# Patient Record
Sex: Male | Born: 1935 | Race: White | Hispanic: No | Marital: Married | Smoking: Former smoker
Health system: Southern US, Community
[De-identification: ages and names within clinical notes are randomized; demographics above are authoritative.]

## PROBLEM LIST (undated history)

## (undated) DIAGNOSIS — I739 Peripheral vascular disease, unspecified: Secondary | ICD-10-CM

## (undated) DIAGNOSIS — Z9861 Coronary angioplasty status: Principal | ICD-10-CM

## (undated) DIAGNOSIS — I2510 Atherosclerotic heart disease of native coronary artery without angina pectoris: Secondary | ICD-10-CM

## (undated) DIAGNOSIS — F1011 Alcohol abuse, in remission: Secondary | ICD-10-CM

## (undated) DIAGNOSIS — F0390 Unspecified dementia without behavioral disturbance: Secondary | ICD-10-CM

## (undated) DIAGNOSIS — F419 Anxiety disorder, unspecified: Secondary | ICD-10-CM

## (undated) DIAGNOSIS — G629 Polyneuropathy, unspecified: Secondary | ICD-10-CM

## (undated) DIAGNOSIS — Z8781 Personal history of (healed) traumatic fracture: Secondary | ICD-10-CM

## (undated) DIAGNOSIS — I714 Abdominal aortic aneurysm, without rupture: Secondary | ICD-10-CM

## (undated) DIAGNOSIS — F32A Depression, unspecified: Secondary | ICD-10-CM

## (undated) DIAGNOSIS — I951 Orthostatic hypotension: Secondary | ICD-10-CM

## (undated) DIAGNOSIS — E785 Hyperlipidemia, unspecified: Secondary | ICD-10-CM

## (undated) DIAGNOSIS — F329 Major depressive disorder, single episode, unspecified: Secondary | ICD-10-CM

## (undated) DIAGNOSIS — I10 Essential (primary) hypertension: Secondary | ICD-10-CM

## (undated) DIAGNOSIS — J449 Chronic obstructive pulmonary disease, unspecified: Secondary | ICD-10-CM

## (undated) HISTORY — DX: Anxiety disorder, unspecified: F41.9

## (undated) HISTORY — DX: Peripheral vascular disease, unspecified: I73.9

## (undated) HISTORY — DX: Alcohol abuse, in remission: F10.11

## (undated) HISTORY — DX: Essential (primary) hypertension: I10

## (undated) HISTORY — DX: Personal history of (healed) traumatic fracture: Z87.81

## (undated) HISTORY — DX: Unspecified dementia, unspecified severity, without behavioral disturbance, psychotic disturbance, mood disturbance, and anxiety: F03.90

## (undated) HISTORY — DX: Depression, unspecified: F32.A

## (undated) HISTORY — DX: Coronary angioplasty status: Z98.61

## (undated) HISTORY — DX: Major depressive disorder, single episode, unspecified: F32.9

## (undated) HISTORY — DX: Atherosclerotic heart disease of native coronary artery without angina pectoris: I25.10

## (undated) HISTORY — DX: Chronic obstructive pulmonary disease, unspecified: J44.9

## (undated) HISTORY — DX: Polyneuropathy, unspecified: G62.9

## (undated) HISTORY — DX: Hyperlipidemia, unspecified: E78.5

## (undated) HISTORY — DX: Abdominal aortic aneurysm, without rupture: I71.4

## (undated) HISTORY — DX: Orthostatic hypotension: I95.1

---

## 1998-03-18 ENCOUNTER — Ambulatory Visit (HOSPITAL_COMMUNITY): Admission: RE | Admit: 1998-03-18 | Discharge: 1998-03-18 | Payer: Self-pay | Admitting: Orthopedic Surgery

## 1998-03-18 ENCOUNTER — Encounter: Payer: Self-pay | Admitting: Orthopedic Surgery

## 1999-03-13 ENCOUNTER — Encounter: Payer: Self-pay | Admitting: Internal Medicine

## 1999-03-13 ENCOUNTER — Ambulatory Visit (HOSPITAL_COMMUNITY): Admission: RE | Admit: 1999-03-13 | Discharge: 1999-03-13 | Payer: Self-pay | Admitting: Internal Medicine

## 2000-02-28 ENCOUNTER — Ambulatory Visit (HOSPITAL_COMMUNITY): Admission: RE | Admit: 2000-02-28 | Discharge: 2000-02-28 | Payer: Self-pay | Admitting: Internal Medicine

## 2000-02-28 ENCOUNTER — Encounter: Payer: Self-pay | Admitting: Internal Medicine

## 2000-12-13 ENCOUNTER — Encounter: Admission: RE | Admit: 2000-12-13 | Discharge: 2000-12-13 | Payer: Self-pay | Admitting: Internal Medicine

## 2000-12-13 ENCOUNTER — Encounter: Payer: Self-pay | Admitting: Internal Medicine

## 2001-06-13 ENCOUNTER — Encounter: Admission: RE | Admit: 2001-06-13 | Discharge: 2001-06-13 | Payer: Self-pay | Admitting: Internal Medicine

## 2001-06-13 ENCOUNTER — Encounter: Payer: Self-pay | Admitting: Internal Medicine

## 2001-09-09 ENCOUNTER — Ambulatory Visit (HOSPITAL_COMMUNITY): Admission: RE | Admit: 2001-09-09 | Discharge: 2001-09-09 | Payer: Self-pay | Admitting: Gastroenterology

## 2002-12-02 ENCOUNTER — Encounter: Payer: Self-pay | Admitting: Internal Medicine

## 2002-12-02 ENCOUNTER — Encounter: Admission: RE | Admit: 2002-12-02 | Discharge: 2002-12-02 | Payer: Self-pay | Admitting: Internal Medicine

## 2003-12-02 ENCOUNTER — Encounter: Admission: RE | Admit: 2003-12-02 | Discharge: 2003-12-02 | Payer: Self-pay | Admitting: Internal Medicine

## 2004-03-29 ENCOUNTER — Ambulatory Visit (HOSPITAL_COMMUNITY): Admission: RE | Admit: 2004-03-29 | Discharge: 2004-03-29 | Payer: Self-pay | Admitting: Internal Medicine

## 2004-04-24 ENCOUNTER — Ambulatory Visit (HOSPITAL_COMMUNITY): Admission: RE | Admit: 2004-04-24 | Discharge: 2004-04-24 | Payer: Self-pay | Admitting: Internal Medicine

## 2004-04-27 ENCOUNTER — Encounter (INDEPENDENT_AMBULATORY_CARE_PROVIDER_SITE_OTHER): Payer: Self-pay | Admitting: Specialist

## 2004-04-27 ENCOUNTER — Ambulatory Visit (HOSPITAL_COMMUNITY): Admission: RE | Admit: 2004-04-27 | Discharge: 2004-04-28 | Payer: Self-pay | Admitting: Interventional Radiology

## 2004-05-11 ENCOUNTER — Ambulatory Visit (HOSPITAL_COMMUNITY): Admission: RE | Admit: 2004-05-11 | Discharge: 2004-05-11 | Payer: Self-pay | Admitting: Interventional Radiology

## 2004-08-31 ENCOUNTER — Encounter: Admission: RE | Admit: 2004-08-31 | Discharge: 2004-08-31 | Payer: Self-pay | Admitting: Internal Medicine

## 2005-12-12 ENCOUNTER — Encounter: Admission: RE | Admit: 2005-12-12 | Discharge: 2005-12-12 | Payer: Self-pay | Admitting: Internal Medicine

## 2006-03-22 HISTORY — PX: CARDIAC CATHETERIZATION: SHX172

## 2006-03-29 ENCOUNTER — Ambulatory Visit (HOSPITAL_COMMUNITY): Admission: RE | Admit: 2006-03-29 | Discharge: 2006-03-30 | Payer: Self-pay | Admitting: *Deleted

## 2006-03-30 HISTORY — PX: PERCUTANEOUS CORONARY STENT INTERVENTION (PCI-S): SHX6016

## 2006-06-12 ENCOUNTER — Encounter: Admission: RE | Admit: 2006-06-12 | Discharge: 2006-06-12 | Payer: Self-pay | Admitting: Internal Medicine

## 2007-08-29 ENCOUNTER — Encounter: Admission: RE | Admit: 2007-08-29 | Discharge: 2007-08-29 | Payer: Self-pay | Admitting: Internal Medicine

## 2007-09-09 ENCOUNTER — Encounter: Admission: RE | Admit: 2007-09-09 | Discharge: 2007-09-09 | Payer: Self-pay | Admitting: Internal Medicine

## 2008-04-16 DIAGNOSIS — Z8781 Personal history of (healed) traumatic fracture: Secondary | ICD-10-CM

## 2008-04-16 HISTORY — DX: Personal history of (healed) traumatic fracture: Z87.81

## 2008-09-07 ENCOUNTER — Emergency Department (HOSPITAL_COMMUNITY): Admission: EM | Admit: 2008-09-07 | Discharge: 2008-09-07 | Payer: Self-pay | Admitting: Emergency Medicine

## 2008-09-19 ENCOUNTER — Inpatient Hospital Stay (HOSPITAL_COMMUNITY): Admission: EM | Admit: 2008-09-19 | Discharge: 2008-09-24 | Payer: Self-pay | Admitting: Emergency Medicine

## 2008-10-15 ENCOUNTER — Encounter: Admission: RE | Admit: 2008-10-15 | Discharge: 2008-10-15 | Payer: Self-pay | Admitting: Neurology

## 2009-06-14 DIAGNOSIS — I714 Abdominal aortic aneurysm, without rupture, unspecified: Secondary | ICD-10-CM

## 2009-06-14 HISTORY — DX: Abdominal aortic aneurysm, without rupture: I71.4

## 2009-06-14 HISTORY — DX: Abdominal aortic aneurysm, without rupture, unspecified: I71.40

## 2009-06-16 HISTORY — PX: OTHER SURGICAL HISTORY: SHX169

## 2009-07-06 ENCOUNTER — Encounter: Admission: RE | Admit: 2009-07-06 | Discharge: 2009-07-06 | Payer: Self-pay | Admitting: Cardiovascular Disease

## 2009-07-20 HISTORY — PX: NM MYOVIEW LTD: HXRAD82

## 2009-08-01 ENCOUNTER — Ambulatory Visit: Payer: Self-pay | Admitting: Vascular Surgery

## 2009-08-16 ENCOUNTER — Encounter: Admission: RE | Admit: 2009-08-16 | Discharge: 2009-08-16 | Payer: Self-pay | Admitting: Vascular Surgery

## 2009-08-16 ENCOUNTER — Ambulatory Visit: Payer: Self-pay | Admitting: Vascular Surgery

## 2009-08-25 ENCOUNTER — Inpatient Hospital Stay (HOSPITAL_COMMUNITY): Admission: RE | Admit: 2009-08-25 | Discharge: 2009-08-28 | Payer: Self-pay | Admitting: Vascular Surgery

## 2009-08-25 ENCOUNTER — Ambulatory Visit: Payer: Self-pay | Admitting: Vascular Surgery

## 2009-08-25 HISTORY — PX: ABDOMINAL AORTIC ANEURYSM REPAIR: SUR1152

## 2009-09-20 ENCOUNTER — Ambulatory Visit: Payer: Self-pay | Admitting: Vascular Surgery

## 2009-09-20 ENCOUNTER — Encounter: Admission: RE | Admit: 2009-09-20 | Discharge: 2009-09-20 | Payer: Self-pay | Admitting: Vascular Surgery

## 2010-04-04 ENCOUNTER — Ambulatory Visit: Payer: Self-pay | Admitting: Vascular Surgery

## 2010-05-06 ENCOUNTER — Other Ambulatory Visit: Payer: Self-pay | Admitting: Vascular Surgery

## 2010-05-06 DIAGNOSIS — I714 Abdominal aortic aneurysm, without rupture: Secondary | ICD-10-CM

## 2010-05-07 ENCOUNTER — Encounter: Payer: Self-pay | Admitting: Internal Medicine

## 2010-05-07 ENCOUNTER — Encounter: Payer: Self-pay | Admitting: Vascular Surgery

## 2010-07-04 ENCOUNTER — Ambulatory Visit (INDEPENDENT_AMBULATORY_CARE_PROVIDER_SITE_OTHER): Payer: Medicare Other | Admitting: Vascular Surgery

## 2010-07-04 ENCOUNTER — Ambulatory Visit
Admission: RE | Admit: 2010-07-04 | Discharge: 2010-07-04 | Disposition: A | Discharge status: Home / Self Care | Payer: Medicare Other | Source: Ambulatory Visit | Attending: Vascular Surgery | Admitting: Vascular Surgery

## 2010-07-04 DIAGNOSIS — I712 Thoracic aortic aneurysm, without rupture, unspecified: Secondary | ICD-10-CM

## 2010-07-04 DIAGNOSIS — I714 Abdominal aortic aneurysm, without rupture: Secondary | ICD-10-CM

## 2010-07-04 LAB — CROSSMATCH
ABO/RH(D): O POS
Antibody Screen: NEGATIVE

## 2010-07-04 LAB — BLOOD GAS, ARTERIAL
Acid-Base Excess: 0.1 mmol/L (ref 0.0–2.0)
Acid-base deficit: 0.1 mmol/L (ref 0.0–2.0)
Drawn by: 206361
Patient temperature: 98.6
TCO2: 24.4 mmol/L (ref 0–100)
TCO2: 25.2 mmol/L (ref 0–100)
pCO2 arterial: 38.8 mmHg (ref 35.0–45.0)
pH, Arterial: 7.469 — ABNORMAL HIGH (ref 7.350–7.450)
pO2, Arterial: 98.4 mmHg (ref 80.0–100.0)

## 2010-07-04 LAB — URINALYSIS, ROUTINE W REFLEX MICROSCOPIC
Nitrite: NEGATIVE
Specific Gravity, Urine: 1.027 (ref 1.005–1.030)
Urobilinogen, UA: 1 mg/dL (ref 0.0–1.0)

## 2010-07-04 LAB — CBC
HCT: 32.5 % — ABNORMAL LOW (ref 39.0–52.0)
HCT: 37.3 % — ABNORMAL LOW (ref 39.0–52.0)
Hemoglobin: 10.9 g/dL — ABNORMAL LOW (ref 13.0–17.0)
Hemoglobin: 11.2 g/dL — ABNORMAL LOW (ref 13.0–17.0)
MCHC: 34.6 g/dL (ref 30.0–36.0)
MCHC: 35.5 g/dL (ref 30.0–36.0)
MCV: 100.2 fL — ABNORMAL HIGH (ref 78.0–100.0)
MCV: 100.5 fL — ABNORMAL HIGH (ref 78.0–100.0)
Platelets: 178 10*3/uL (ref 150–400)
RBC: 3.06 MIL/uL — ABNORMAL LOW (ref 4.22–5.81)
RDW: 14.4 % (ref 11.5–15.5)
RDW: 14.5 % (ref 11.5–15.5)

## 2010-07-04 LAB — COMPREHENSIVE METABOLIC PANEL
ALT: 10 U/L (ref 0–53)
AST: 19 U/L (ref 0–37)
Albumin: 3.3 g/dL — ABNORMAL LOW (ref 3.5–5.2)
BUN: 12 mg/dL (ref 6–23)
CO2: 27 mEq/L (ref 19–32)
Calcium: 8 mg/dL — ABNORMAL LOW (ref 8.4–10.5)
Calcium: 8.9 mg/dL (ref 8.4–10.5)
Creatinine, Ser: 0.93 mg/dL (ref 0.4–1.5)
Creatinine, Ser: 0.99 mg/dL (ref 0.4–1.5)
GFR calc Af Amer: >60 mL/min (ref >60)
GFR calc non Af Amer: >60 mL/min (ref >60)
Glucose, Bld: 132 mg/dL — ABNORMAL HIGH (ref 70–99)
Total Protein: 6 g/dL (ref 6.0–8.3)

## 2010-07-04 LAB — BASIC METABOLIC PANEL
CO2: 24 mEq/L (ref 19–32)
Glucose, Bld: 123 mg/dL — ABNORMAL HIGH (ref 70–99)
Potassium: 3.6 mEq/L (ref 3.5–5.1)
Sodium: 138 mEq/L (ref 135–145)

## 2010-07-04 LAB — PROTIME-INR: INR: 0.98 (ref 0.00–1.49)

## 2010-07-04 LAB — APTT: aPTT: 31 seconds (ref 24–37)

## 2010-07-04 MED ORDER — IOHEXOL 350 MG/ML SOLN
100.0000 mL | Freq: Once | INTRAVENOUS | Status: AC | PRN
  Administered 2010-07-04: 100 mL via INTRAVENOUS

## 2010-07-05 NOTE — Assessment & Plan Note (Signed)
OFFICE VISIT  Alexander Randolph, Alexander Randolph DOB:  31-Jul-1931                                       07/04/2010 ZOXWR#:[id number]  The patient returns today for continued follow-up regarding his infrarenal abdominal aortic aneurysm repair which included a Gore excluder stent graft on Aug 25, 2009.  This went to both common iliac arteries with a proximal extension for a small type 1 leak in the OR, which resolved post the extension.  He has well since then with no abdominal or new back symptoms, although he does have chronic back pain.  CHRONIC MEDICAL PROBLEMS UNDER GOOD CONTROL: 1. Hypertension. 2. Hyperlipidemia. 3. Depression and anxiety. 4. Peripheral neuropathy. 5. Coronary artery disease, previous PTCA and stenting in 2008.  FAMILY HISTORY:  Positive for coronary artery disease in his father, who died of an MI at age 72.  Negative for diabetes and stroke.  REVIEW OF SYSTEMS:  Positive for weight loss, anorexia, reflux esophagitis.  Denies any chest pain or dyspnea on exertion.  Does not ambulate long distances.  Chronic back pain.  All other systems are negative in complete review of systems.  PHYSICAL EXAMINATION:  Blood pressure 99/64, heart rate 74, respirations 20.  Generally, he is a well-developed, well-nourished male in no apparent distress, alert and oriented x3.  HEENT:  Exam is normal for age.  EOMs intact.  Lungs:  Clear to auscultation.  No rhonchi or wheezing.  Cardiovascular:  A regular rhythm, no murmurs.  Carotid pulses 3+, no audible bruits.  Abdomen:  Soft, nontender, no pulsatile masses noted.  He has 3+ femoral and dorsalis pedis pulses bilaterally. Neurologic exam reveals decreased sensation in both feet.  Today I ordered a CT angiogram, which I reviewed by computer.  There is no evidence of endoleak, and the aneurysm continues to contract down around the graft, today measuring about 47 mm in maximum diameter compared to 53 at the last  visit.  Generally, he is doing well.  We will see him back in 12 months with a duplex scan of his aneurysm performed in our office at the time of return.    Quita Skye Hart [place], M.D. Electronically Signed  JDL/MEDQ  D:  07/04/2010  T:  07/05/2010  Job:  [id number]

## 2010-07-24 LAB — CBC
Hemoglobin: 13.4 g/dL (ref 13.0–17.0)
MCHC: 34.7 g/dL (ref 30.0–36.0)
MCHC: 35.1 g/dL (ref 30.0–36.0)
Platelets: 238 10*3/uL (ref 150–400)
RBC: 3.47 MIL/uL — ABNORMAL LOW (ref 4.22–5.81)
RDW: 14.8 % (ref 11.5–15.5)
WBC: 6.5 10*3/uL (ref 4.0–10.5)

## 2010-07-24 LAB — DIFFERENTIAL
Lymphs Abs: 1.2 10*3/uL (ref 0.7–4.0)
Monocytes Absolute: 0.6 10*3/uL (ref 0.1–1.0)
Monocytes Relative: 10 % (ref 3–12)
Neutro Abs: 4.5 10*3/uL (ref 1.7–7.7)
Neutrophils Relative %: 69 % (ref 43–77)

## 2010-07-24 LAB — CARDIAC PANEL(CRET KIN+CKTOT+MB+TROPI)
CK, MB: 0.9 ng/mL (ref 0.3–4.0)
CK, MB: 1.1 ng/mL (ref 0.3–4.0)
Relative Index: INVALID (ref 0.0–2.5)
Relative Index: INVALID (ref 0.0–2.5)
Total CK: 43 U/L (ref 7–232)
Total CK: 48 U/L (ref 7–232)
Total CK: 48 U/L (ref 7–232)
Troponin I: 0.02 ng/mL (ref 0.00–0.06)

## 2010-07-24 LAB — COMPREHENSIVE METABOLIC PANEL
ALT: 13 U/L (ref 0–53)
AST: 21 U/L (ref 0–37)
Albumin: 3.1 g/dL — ABNORMAL LOW (ref 3.5–5.2)
BUN: 13 mg/dL (ref 6–23)
CO2: 24 mEq/L (ref 19–32)
Calcium: 8.8 mg/dL (ref 8.4–10.5)
Calcium: 9 mg/dL (ref 8.4–10.5)
GFR calc Af Amer: >60 mL/min (ref >60)
GFR calc non Af Amer: >60 mL/min (ref >60)
Glucose, Bld: 118 mg/dL — ABNORMAL HIGH (ref 70–99)
Potassium: 3.6 mEq/L (ref 3.5–5.1)
Potassium: 4.7 mEq/L (ref 3.5–5.1)
Sodium: 142 mEq/L (ref 135–145)
Sodium: 142 mEq/L (ref 135–145)
Total Protein: 6.1 g/dL (ref 6.0–8.3)

## 2010-07-24 LAB — POCT CARDIAC MARKERS: Myoglobin, poc: 188 ng/mL (ref 12–200)

## 2010-07-24 LAB — FERRITIN: Ferritin: 253 ng/mL (ref 22–322)

## 2010-07-24 LAB — ABO/RH: ABO/RH(D): O POS

## 2010-07-24 LAB — GLUCOSE, CAPILLARY

## 2010-07-24 LAB — URINALYSIS, ROUTINE W REFLEX MICROSCOPIC
Nitrite: NEGATIVE
Specific Gravity, Urine: 1.022 (ref 1.005–1.030)
Urobilinogen, UA: 1 mg/dL (ref 0.0–1.0)
pH: 7.5 (ref 5.0–8.0)

## 2010-07-24 LAB — TROPONIN I: Troponin I: 0.06 ng/mL (ref 0.00–0.06)

## 2010-07-24 LAB — RETICULOCYTES: Retic Ct Pct: 1.3 % (ref 0.4–3.1)

## 2010-07-24 LAB — LIPID PANEL
Cholesterol: 143 mg/dL (ref 0–200)
HDL: 55 mg/dL (ref >39)
LDL Cholesterol: 70 mg/dL (ref 0–99)
Total CHOL/HDL Ratio: 2.6 RATIO

## 2010-07-24 LAB — CK TOTAL AND CKMB (NOT AT ARMC)
CK, MB: 1.2 ng/mL (ref 0.3–4.0)
Total CK: 45 U/L (ref 7–232)

## 2010-07-24 LAB — TYPE AND SCREEN
ABO/RH(D): O POS
Antibody Screen: NEGATIVE

## 2010-07-24 LAB — FOLATE: Folate: 9 ng/mL

## 2010-07-24 LAB — IRON AND TIBC: Iron: 124 ug/dL (ref 42–135)

## 2010-07-24 LAB — PROTIME-INR: INR: 0.9 (ref 0.00–1.49)

## 2010-07-24 LAB — APTT: aPTT: 20 seconds — ABNORMAL LOW (ref 24–37)

## 2010-07-24 LAB — HEMOGLOBIN A1C: Mean Plasma Glucose: 103 mg/dL

## 2010-07-25 LAB — POCT I-STAT, CHEM 8
BUN: 19 mg/dL (ref 6–23)
Calcium, Ion: 1.09 mmol/L — ABNORMAL LOW (ref 1.12–1.32)
Creatinine, Ser: 1.2 mg/dL (ref 0.4–1.5)
Glucose, Bld: 75 mg/dL (ref 70–99)
TCO2: 21 mmol/L (ref 0–100)

## 2010-08-29 NOTE — Assessment & Plan Note (Signed)
OFFICE VISIT   Alexander Randolph, Alexander Randolph  DOB:  30-Jan-1932                                       08/16/2009  FAOZH#:[id number]   The patient returns today with his daughter and wife for continued  discussion regarding his abdominal aortic aneurysm.  He had a CT  angiogram today with 3-mm cuts some including the pelvis, which I have  reviewed.  It appears that he is likely a stent graft candidate.  Although the neck is somewhat irregular, I think we can get a good seal.  He had a Cardiolite study performed at Dr. Kandis Cocking office which  revealed no evidence of ischemia, a low-risk study.  Ejection fraction  is 50%-55%.   CHRONIC MEDICAL PROBLEMS:  1. Hypertension.  2. Hyperlipidemia.  3. Depression and anxiety.  4. Dementia.  5. Peripheral neuropathy.  6. Coronary artery disease with previous PTCA and stenting in 2008.   FAMILY HISTORY:  Positive for coronary artery disease in his father, who  died of an MI at age 69.   SOCIAL HISTORY:  Married, has 3 children, is retired.  Does not use  tobacco, has not since 1978.   REVIEW OF SYSTEMS:  Negative chest pain, dyspnea on exertion.  No  bronchitis, hemoptysis, wheezing, etc.   PHYSICAL EXAMINATION:  Blood pressure is 96/67, heart rate 73,  respirations 16.  In general, he is a well-developed, well-nourished,  thin male who is in no apparent distress, alert and oriented x3.  The  HEENT exam is normal.  EOMs intact.  Chest clear to auscultation.  Cardiovascular exam is a regular rhythm, no murmurs.  Abdomen is soft,  nontender, with a pulsatile mass approximating 5.5 cm.  Lower extremity  exam reveals 3+ femoral and right dorsalis pedis and left posterior  tibial pulse.   I discussed the situation with him, his daughter and his wife, in fact,  that we do think he is a stent graft candidate.   The patient does exhibit evidence of dementia, which was confirmed by  the family.  While he wanted to but the  procedure off until the fall,  the family wished to proceed and the wife is the health care power of  attorney.  I discussed the potential risks of the procedure and we will  proceed on Thursday, May 12, with aortic stent grafting if feasible and  if this is not feasible, we will then proceed with an open repair of the  aneurysm.     Quita Skye Hart [place], M.D.  Electronically Signed   JDL/MEDQ  D:  08/16/2009  T:  08/17/2009  Job:  [id number]

## 2010-08-29 NOTE — Assessment & Plan Note (Signed)
OFFICE VISIT   LOI, RENNAKER  DOB:  1931/10/14                                       09/20/2009  XBJYN#:[id number]   The patient returns today for initial followup regarding his recent  stent graft repair of an infrarenal abdominal aortic aneurysm performed  on 05/12.  He had a Biomedical scientist aortobicommon iliac graft performed  and did need insertion of a 32 mm proximal extension cuff for a slight  type 1 leak in the OR.  The completion angiogram revealed resolution  with no further type 1 leak.  He has done well since his discharge from  the hospital.  His wife states his appetite is not real good but he is  drinking plenty of fluids and not losing weight.   PHYSICAL EXAMINATION:  Today his blood pressure is 102/66, heart rate  73, temperature 98.  Abdomen soft with no pulsatile mass noted.  He has  well-healed inguinal incisions.  He has 3+ femoral, posterior tibial  pulses bilaterally.  ABIs today are 1.1 bilaterally.   I reviewed his CT angiogram today and there is no evidence of any endo  leak or migration of the graft and it is in excellent position with a  maximum diameter of 5.3 cm compared to 5.6 preoperatively.  I have  reassured them regarding these findings and he will return in 6 months  with a CT angiogram to be done the day of his next appointment.     Quita Skye Hart [place], M.D.  Electronically Signed   JDL/MEDQ  D:  09/20/2009  T:  09/21/2009  Job:  [id number]

## 2010-08-29 NOTE — Discharge Summary (Signed)
NAME:  Alexander Randolph, HENKELS NO.:  [id number]   MEDICAL RECORD NO.:  [id number]          PATIENT TYPE:  INP   LOCATION:  3011                         FACILITY:  [facility]   PHYSICIAN:  Deirdre Peer. Polite, M.D. DATE OF BIRTH:  Jul 19, 1931   DATE OF ADMISSION:  09/19/2008  DATE OF DISCHARGE:                               DISCHARGE SUMMARY   DISCHARGE DIAGNOSES:  1. Frequent falls, multifactorial secondary to ethanol dependence,      neuropathy, poor safety awareness.  2. Left hip laceration, status post suturing in the emergency      department.  Will require suture removal in approximately 1 week.  3. Ethanol dependence.  4. Hypertension.  5. Coronary artery disease.  6. Frequent falls, currently with displaced oblique proximal right      fibular fracture, currently in boot followed by outpatient      orthopedics, Dr. Ranell Patrick per patient.   DISCHARGE MEDICATIONS:  1. Aspirin 81 mg daily.  2. Lexapro 10 mg daily.  3. Melatonin.  4. Nexium 40 mg daily.  5. Plavix 75 mg daily.  6. Vytorin 10/10.  7. Seroquel 25 mg b.i.d. p.r.n. agitation.  8. Lopressor 12.5 mg b.i.d.  9. Thiamine 100 mg daily.  10.Folic acid 1 mg daily.  11.Tylenol 650 q.4-6 h., p.r.n.   DISPOSITION:  The patient will be discharged to a skilled nursing  facility.   CONSULTANTS:  Dr. Jeanie Sewer of psychiatry.   STUDIES:  Chest x-ray:  No acute disease.  Pelvis x-ray, no acute bony  findings.  Maxillofacial CT, no acute intracranial findings of skull or  skull fracture.  No facial bone fractures.  CT of the neck, no acute  bony findings.   HISTORY OF PRESENT ILLNESS:  A 77 year old male brought to the hospital  for mental status change.  The patient has a history of alcohol  dependence, anxiety and depression and has been having frequent falls.  Admission was deemed necessary for further evaluation and treatment.  Please see dictated H and P for further details.   PAST MEDICAL HISTORY:  Per admission  H and P.   MEDS ON ADMISSION:  Per admission H and P.   SOCIAL HISTORY:  Positive for daily alcohol.   FAMILY HISTORY:  Noncontributory.   ALLERGIES:  NO KNOWN DRUG ALLERGIES.   HOSPITAL COURSE:  The patient was admitted to the medicine floor bed for  evaluation and treatment for mental status change and frequent falls.  Infectious etiology was excluded.  UA was negative.  CBC without  leukocytosis.  CT of the head without any acute abnormalities, chest x-  ray without any acute abnormalities.  The patient has had unusual  behavior for a while and it seems that a lot of his problems stem from  his alcohol dependence.  This issue was addressed during this  hospitalization.  The patient was given p.r.n. Ativan.  There was no  active withdrawal.  The patient did occasionally have some fluctuations  in mood which required p.r.n. Seroquel as well.  The patient was seen in  consultation by psychiatry and it was felt that  Mr. Buzby demonstrated  critical impairment and reasoning, as well as lack of memory and  appreciation of this constitutional risk.  Also, he stated that he  lacked appreciation for the care he required and was felt that he did  not have informed consent.  After several discussions with the family,  it was that the patient would be better cared for in a skilled nursing  facility at this time and at some point may require some outpatient  treatment for alcohol dependence.  At this time, the patient is  medically stable for discharge to a skilled nursing facility.      Deirdre Peer. Polite, M.D.  Electronically Signed     RDP/MEDQ  D:  09/24/2008  T:  09/24/2008  Job:  [id number]

## 2010-08-29 NOTE — Consult Note (Signed)
NAME:  Alexander Randolph, Alexander Randolph NO.:  [id number]   MEDICAL RECORD NO.:  [id number]          PATIENT TYPE:  INP   LOCATION:  6703                         FACILITY:  [facility]   PHYSICIAN:  Antonietta Breach, M.D.  DATE OF BIRTH:  [birth year]   DATE OF CONSULTATION:  09/21/2008  DATE OF DISCHARGE:                                 CONSULTATION   REQUESTING PHYSICIAN:  Renford Dills, M.D.   REASON FOR CONSULTATION:  Mental status changes, anxiety, alcoholism,  assess capacity.   HISTORY OF PRESENT ILLNESS:  Mr. Alexander Randolph is a 77 year old male  admitted to the [facility] on September 19, 2008, due to falling and  altered mental status.   He has been drinking heavily.  He drinks at least six mixed drinks a day  and possibly more.  His favorite liquor is scotch.   He has been developing progressive memory difficulty over approximately  six months.  He also has been having falls and clouding of  consciousness.  He has been found on the side of the road by the family  and brought back to the house.   Today in the hospital room, he is demonstrating severe short-term memory  recall difficulty.  His ability to appreciate his general medical  problems is poor.  His judgment is also impaired.  He has been placed on  Seroquel 25 mg b.i.d.  He also has been placed on thiamine 100 mg daily  as well as Ativan p.r.n.  He is not currently combative.  He is  cooperative with bedside care.   He is not having any hallucinations or delusions.  His orientation is  partially intact.   PAST PSYCHIATRIC HISTORY:  In review of the past medical record, Mr.  Hoefling has anxiety listed.  He has been on Lexapro.   He does have a long-term history of excessive alcohol intake going on  for many years.   FAMILY PSYCHIATRIC HISTORY:  None known.   SOCIAL HISTORY:  [name] is married.  He has been married over 50  years.  His wife also drinks.   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   He has never used illegal drugs.   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  He is originally from [place].  He  is currently retired.   He lives at home with his wife, 14 cats and a dog.   PAST MEDICAL HISTORY:  Prior to admission, he sustained a fall with a  large ecchymosis to the left periorbital area and a laceration in the  left zygomatic arch region which has been sutured.  He has a history of  hypertension, coronary artery disease as well as neuropathy.   MEDICATIONS:  MAR reviewed.  He is currently on Lexapro 10 mg daily and  is receiving Ativan 1 mg p.r.n. withdrawal.   He has no known drug allergies.   LABORATORY DATA:  RPR, folic acid, B12, TSH, SGOT, SGPT  all  unremarkable.  His head CT without contrast was unremarkable.   REVIEW OF SYSTEMS:  He is only able to provide part of this.  The rest  is gleaned from his daughter, staff and the medical record.   Constitutional, head, eyes, ears, nose, throat, mouth, neurologic,  psychiatric, cardiovascular, respiratory, gastrointestinal,  genitourinary, skin, musculoskeletal, hematologic, lymphatic, endocrine,  metabolic all unremarkable.   EXAMINATION:  VITAL SIGNS:  Temperature 98.2, pulse 69, respiratory rate  18, blood pressure 140/81, O2 saturation on room air 96%.  GENERAL APPEARANCE:  [name] is an elderly male sitting up in his  hospital chair with no abnormal involuntary movements.  He does not  display sweats or tremors.   MENTAL STATUS EXAM:  [name] is alert.  His eye contact is good.  His attention span is mildly decreased.  Concentration mildly decreased.  Affect is slightly anxious.  Mood is slightly anxious.  On orientation  testing, he states the year correctly.  He does know the month.  He  misses the name of the hospital and the floor of the hospital.  Memory   testing 3/3 immediate, 0/3 on recall.  He is oriented to person.  Fund  of knowledge and intelligence are below that of his estimated premorbid  baseline.  His speech involves normal rate with a slightly flat prosody.  There is no dysarthria.  Thought process is coherent.  Thought content:  No thoughts of harming himself or others.  No delusions or  hallucinations.  Insight is impaired.  Judgment is impaired.   ASSESSMENT:  AXIS I:  293.84 anxiety disorder not otherwise specified.  This does appear to be  controlled on Lexapro.  Alcohol dependence.  Dementia not otherwise specified.  AXIS II:  Deferred.  AXIS III:  See past medical history.  AXIS IV:  Primary support group, general medical.  AXIS V:  40.   [name] does demonstrate critical impairment in reasoning as well as  memory and appreciation of his constitutional risks.  He also lacks  appreciation for the care he requires,   He does not have the capacity for informed consent.   RECOMMENDATIONS:  1. Would continue thiamine 100 mg daily indefinitely.  2. Would continue his Lexapro 10 mg daily for anti-anxiety, observing      for any nausea or loose stools as side effects.  3. Concur with a skilled nursing facility stay for physical      rehabilitation.  4. If he does regain his memory function, he would be a candidate for      chemical dependence rehabilitation and Alcoholics Anonymous      meetings.  5. Would taper off of his Seroquel and only utilize if he displays      hallucinations or delusions.  6. Concur with using Ativan p.r.n. only through the potential      withdrawal window.  7. Recovery of memory with thiamine can occur as late as 6 weeks.  If      [name] does not recover his memory function, would consider      starting Aricept 5 mg every night with caution regarding      cholinergic intestinal side effects such as diarrhea.      Antonietta Breach, M.D.  Electronically Signed     JW/MEDQ  D:   09/21/2008  T:  09/21/2008  Job:  [id number]

## 2010-08-29 NOTE — Consult Note (Signed)
NEW PATIENT CONSULTATION   Alexander Randolph, Alexander Randolph  DOB:  08/23/1931                                       08/01/2009  ZOXWR#:[id number]   The patient is a 77 year old male referred by Dr. Alanda Amass for  evaluation of enlarging infrarenal abdominal aortic aneurysm which has  been followed for several years.  He recently had an ultrasound study  performed at [facility].  I have reviewed that  report.  This reveals an aneurysm to be 5.4 cm maximum diameter.  This  was then confirmed with a CT scan of the abdomen performed on March 23  at [facility] revealing a 5.5 x 5.4-cm maximum diameter of this  aneurysm.  I have reviewed the report and also reviewed the CT scan  today.  The patient denies any abdominal symptoms.  He does have chronic  back problems related to his lumbar spine with chronic pain.   PAST MEDICAL HISTORY:  Chronic problems:  1. Hypertension.  2. Hyperlipidemia.  3. Depression and anxiety.  4. Peripheral neuropathy.  5. Coronary artery disease, previous PTCA and stenting in 2008.      Recent echocardiogram report reviewed by me with a normal LV      function, but has not had a stress nuclear study in a few years.      The patient denies any history of COPD or stroke.   FAMILY HISTORY:  Positive for coronary artery disease in his father who  died of myocardial infarction age 23.  Negative for diabetes and stroke.   SOCIAL HISTORY:  Married, has three children and is retired.  Does not  use tobacco, but quit smoking in 1978.  Does have a history of ethanol  abuse, but does not drink alcohol.   REVIEW OF SYSTEMS:  Positive for weight loss, slight anorexia.  His  family states he has lost 20 pounds in the last several months.  No  active chest pain, dyspnea on exertion, asthma, wheezing, hemoptysis,  dysphagia, denies any GU symptoms.  Does have occasional dizziness and  has some depression.  All other systems in review of  systems are  negative.   PHYSICAL EXAMINATION:  Vital signs:  Blood pressure is 84/60, heart rate  75, respirations 18.  General:  He is an elderly well-developed, well-  nourished male who is in no apparent distress.  HEENT:  Exam is normal.  EOMs intact.  Lungs:  Clear to auscultation.  No rhonchi or wheezing.  Cardiovascular:  Regular rhythm.  No murmurs.  Carotid pulses 3+.  No  audible bruits.  Lower extremity pulses are 3+ at femoral popliteal  level, 2+ right dorsalis pedis, 2+ left posterior tibial.  No edema.  Abdomen:  Soft, nontender with 5-6 cm pulsatile mass in the  midepigastrium.  Musculoskeletal:  Exam is free of major deformities.  Neurologic:  Normal except for some decreased sensation in the feet.  Skin:  Free of rashes.   Having reviewed all of his clinical data and a CT scan, I feel the  patient is most likely a candidate for stent grafting of his aortic  aneurysm.  I have discussed this at length with him and his wife.  Unfortunately we do not have complete examination with the current CT  scan to show the iliac arteries in the pelvis and better cuts (3  mm) in  the periaortic region.  We will obtain a CT scan with contrast using a  stent graft protocol and also request a nuclear stress test to be done  by Dr. Alanda Amass and the patient will return in 2-3 weeks to discuss  these findings to see if he is a candidate for aortic stent grafting for  his aortic aneurysm.     Quita Skye Hart Rochester, M.D.  Electronically Signed   JDL/MEDQ  D:  08/01/2009  T:  08/02/2009  Job:  [id number]   cc:   Gerlene Burdock A. Alanda Amass, M.D.  Deirdre Peer. Polite, M.D.

## 2010-08-29 NOTE — H&P (Signed)
NAME:  Alexander Randolph, Alexander Randolph              ACCOUNT NO.:  [id number]   MEDICAL RECORD NO.:  [id number]          PATIENT TYPE:  INP   LOCATION:  6703                         FACILITY:  [facility]   PHYSICIAN:  Michiel Cowboy, MDDATE OF BIRTH:  [birth year]   DATE OF ADMISSION:  09/19/2008  DATE OF DISCHARGE:                              HISTORY & PHYSICAL   PRIMARY CARE Dalena Plantz:  Deirdre Peer. Polite, M.D.   CHIEF COMPLAINT:  Altered mental status and fall.   The patient is a 77 year old gentleman with past medical history  significant for coronary artery disease and hypertension as well as  anxiety.  Per his wife, for at least 6 months or more. he had been  having problems with memory, altered mental status, and frequent falls.  He has history of neuropathy of unclear etiology, worked up by Dr. Sandria Manly.  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  The family is very worried because he has fallen  a number of times, about 6 times in the past few months, suffering ankle  sprain then and fracture and then today falling in the garage, hitting  his head that required stitching.  The patient states he cannot remember  how he had fallen.  His family says he does not follow instructions.  He  was instructed not to take of his cast which he did.  He seemed to be  ambulating outside without his shoes on and he had slipped and fallen,  suffered a fairly significant trauma to his head requiring stitches.  CT  scan of his brain was done, was unremarkable.  He was somewhat altered  when he came in, did not quite know where he was or what was going on  but as time progressed he has become much more clear.  Per wife, he has  occasionally episodes of significant confusion, alternating with fairly  clear mindedness.  She had  been trying to figure out what is going on  for quite some time.  He had an MRI in May which showed a small lacunar  infarct but otherwise unremarkable.   PAST MEDICAL HISTORY:  1. Anxiety.  2. Hypertension.  3. Coronary artery disease.  4. Neuropathy.   SOCIAL HISTORY:  The patient used to smoke but quit 30 years ago.  Does  not drink except for occasionally.   FAMILY HISTORY:  Noncontributory.   ALLERGIES:  No known drug allergies.   MEDICATIONS:  His wife does not quite know but thinks he takes:  1. Aspirin 81 mg a day.  2. Lexapro, unknown dose.  3. Melatonin.  4. Nexium.  5. Plavix 75 mg daily.  6. Vytorin 10/10 mg.  Not sure about the rest of his meds.   REVIEW OF SYSTEMS:  Unable to obtain quite from the patient in details  but in short, he denies any chest pain, shortness of breath.  He  cannot  remember how he has fallen, if he has lost consciousness or not but  possibly did.  Otherwise review of systems is unable to obtain.    VITAL SIGNS:  Temperature 97.6, blood pressure 159/82, pulse 76,  respirations 16, saturating 99% on 2 liters.  The patient appears to be currently in no acute distress.  HEAD: With significant laceration around the left eye, continuing to  ooze.  Bruises over the face.  SKIN:  There are old bruises noted throughout the body, in particular on  left side and left flank.  HEART:  Regular rate and rhythm.  LUNGS: Clear to auscultation bilaterally.  LOWER EXTREMITIES: Left is without edema, clubbing or cyanosis.  Right  is hard to examine secondary to being in a cast.  NEUROLOGICALLY:  Strength 5/5 in all 3 extremities; unable to examine  his right foot.  Skin, see above.  The patient currently alert and  oriented x3.  Seems to be appropriate.   LABORATORY DATA:  White blood cell count 6.5, hemoglobin 13.4. Sodium  142, potassium 4.7, creatinine 1.23, total bilirubin 1.7. Otherwise LFTs  within normal limits except for albumin 3.1.  Cardiac  enzymes negative.  UA negative.  CT scan of the head, face and neck unremarkable.  No acute  fractures.  Pelvic films unremarkable.  Chest x-ray show low lung  volumes but otherwise unremarkable.   EKG showing normal sinus rhythm, no ST changes which would suggest  ischemia.  No significant change from EKG obtained in May.   ASSESSMENT AND PLAN:  This is a 77 year old gentleman with altered  mental status and a history of neuropathy, unsteady gait and frequent  falls.  1. Frequent falls.  The patient had an MRI already done in May that      did showed small lacunar infarct and I wonder if this has anything      to do with his unsteady gait.  Will check B12, TSH, folate.  Check      RPR.  Recommend neurology consult.  Given strange behavior and      occasional confusion as well as continued difficulty in      comprehension, will recommend psych consult as well.  Will not      repeat MRI as the patient had a recent one and neurologically does      not seem to be different, just that he suffered another fall.  2. History of hypertension in the past.  He was on metoprolol,      currently elevated blood pressures and this was  continued.  3. History of neuropathy.  He was on Lyrica in the past but does not      seem to be on this now.  We need to really clarify what medicines      he on and what is the cause of the neuropathy.  We could either      start Lyrica or Neurontin.  Check hemoglobin A1c.  4  History of coronary artery disease.  Given altered mental status  earlier, cycle cardiac enzymes.  Continue Vytorin, metoprolol and  aspirin. to the back up to prior.  Will hold Plavix as the patient is  still oozing from his wounds and  had suffered recent trauma.  The patient's last catheterization seems to  be in May 2007 with stent placement.  1. Prophylaxis:  Protonix and SCDs.  Will avoid Lovenox.   CODE STATUS:  The patient is, per wife, Do Not Resuscitate and  Do Not  Intubated  (DNR/DNI).      Michiel Cowboy, MD  Electronically Signed     AVD/MEDQ  D:  09/19/2008  T:  09/20/2008  Job:  [id number]   cc:   Deirdre Peer. Polite, M.D.

## 2010-09-01 NOTE — Cardiovascular Report (Signed)
NAME:  Alexander Randolph, Alexander Randolph NO.:  [id number]   MEDICAL RECORD NO.:  [id number]          PATIENT TYPE:  OIB   LOCATION:  6533                         FACILITY:  [facility]   PHYSICIAN:  Darlin Priestly, MD  DATE OF BIRTH:  Jan 30, 1932   DATE OF PROCEDURE:  DATE OF DISCHARGE:  03/30/2006                            CARDIAC CATHETERIZATION   PROCEDURES:  1. Coronary angiography.  2. RCA-mid.  - Percutaneous transluminal coronary balloon angioplasty.  - Placement of intracoronary stent.  1. RCA-proximal.  - Percutaneous transluminal coronary balloon angioplasty.  - Placement of intracoronary stent.   ATTENDING SURGEON:  Dr. Lenise Herald   COMPLICATIONS:  None.   INDICATION:  Alexander Randolph is a 77 year old male patient of Dr. Ladell Pier with a history of hypertension, hyperlipidemia, history of  abnormal stress test with subsequent cardiac catheterization revealing  subtotal mid-AV groove circumflex with sequential 95% lesions in the  proximal mid-RCA.  He is now brought for percutaneous intervention of  the RCA.  He is known to have normal LV function with inferior  hypokinesis.   DESCRIPTION OF OPERATION:  After obtaining informed written consent, the  patient was brought to the cardiac cath lab where the right and left  groin were shaved, prepped and draped in the usual sterile fashion.  Anesthesia monitor established.  Using the modified Seldinger technique,  6-French intraarterial sheaths were inserted in the right femoral  artery.  A 6-French JR-4 guiding catheter with side holes __________ and  engaged in the right coronary ostium.  Following this, we attempted to  cross the proximal mid-stenotic lesions with a Prowater 0.014 guidewire,  however we were unable to successfully cross the mid-RCA lesion.  This  wire was then exchanged for a 0.014 Whisper wire which was then able to  successfully cross the lesion and position the distal PDA without  difficulty.   Over this, we took a Voyager 2.25 x 15 mm balloon across  the distal lesion.  Two inflations to maximum of 10 atmospheres were  performed for a total of approximately 32 seconds.  Followup angiogram  revealed good luminal gain with no dissection.  This balloon was then  pulled into the proximal segment and two inflations to 14 atmospheres  were performed for a total of approximately 40 seconds.  Followup  angiogram revealed good luminal gain with no evidence of a dissection or  thrombus.  This balloon was then removed and a Cypher 2.5 x 18 mm stent  was then positioned across the mid-RCA stenotic lesion.  The stent was  inflated to 10 atmospheres for a total of 30 seconds.  A second  inflation to 12 atmospheres was performed for a total of 17 seconds.  Followup angiogram revealed excellent luminal gain and no evidence of a  dissection or thrombus.  This balloon was then pulled into the proximal  segment of the RCA to measure the proximal lesion and then removed.  This balloon was then exchanged for a Cypher 2.5 x 23 mm stent which was  then positioned across the proximal RCA stenotic lesion.  This stent was  then deployed to 14 atmospheres for a total of 25 seconds.  A second  inflation of 14 atmospheres was performed for a total of 18 seconds.  Followup angiogram revealed no evidence of a dissection or thrombus with  TIMI flow to distal vessel.  We then took a PowerSail 2.5 x 8 mm balloon  into the mid-RCA stenotic lesion and did three inflations to a maximum  of 12 atmospheres for a total of approximately 45 seconds.  Followup  angiogram revealed no evidence of a dissection with good stented  position.  This balloon was then folded into the proximal portion of the  RCA and three inflations were then performed to a maximum of 16  atmospheres for a total of approximately 45 seconds.  Followup angiogram  revealed good luminal gain with no evidence of a dissection.  IV  Angiomax was used  throughout the case.   Final __________  angiograms reveal less than 10% residual stenosis in  the proximal and mid-RCA stenotic lesions with TIMI-3 flow to distal  vessel.  At this point, we elected to conclude the procedure.  All  balloons, wires and catheter removed.  Hemostatic sheaths were sewn in  place and the patient was transferred back to the room in stable  condition.   CONCLUSIONS:  1. Successful percutaneous transluminal coronary balloon angioplasty      of the mid-RCA stenotic lesion with placement of a 2.5 x 18 mm      Cypher stent, ultimately post dilated to approximately 2.6 mm.  2. Successful percutaneous transluminal coronary balloon angioplasty      and placement of a Cypher 2.5 x 23 mm stent in the proximal RCA      stenotic lesion, ultimately post dilated to approximately 2.7 mm.  3. Adjuvant use of Angiomax infusion.      Darlin Priestly, MD  Electronically Signed     RHM/MEDQ  D:  03/29/2006  T:  03/30/2006  Job:  [id number]   cc:   Ladell Pier, M.D.

## 2010-09-01 NOTE — Discharge Summary (Signed)
NAME:  Alexander Randolph, Alexander Randolph NO.:  [id number]   MEDICAL RECORD NO.:  [id number]          PATIENT TYPE:  OIB   LOCATION:  6533                         FACILITY:  [facility]   PHYSICIAN:  Darlin Priestly, MD  DATE OF BIRTH:  11-03-1931   DATE OF ADMISSION:  03/29/2006  DATE OF DISCHARGE:  03/30/2006                               DISCHARGE SUMMARY   DISCHARGE DIAGNOSES:  1. Coronary disease, elective two site right coronary artery CYPHER      stenting this admission.  2. Treated hypertension.  3. Treated dyslipidemia   HOSPITAL COURSE:  Alexander Randolph is a 77 year old male, who was evaluated  by Dr. Jenne Campus back in November for an abnormal screening Cardiolite  study.  This study was in fact abnormal with inferior ischemia with  normal LV function.  He was set up for a diagnostic catheterization  which was done as an outpatient on March 22, 2006.  This revealed 95%  proximal RCA, 95% mid RCA, 40% LAD, 30% left main tapering, and subtotal  distal portion of the OM and AV groove with faint left-to-left  collaterals.  EF was 50%.  The patient was set up for elective RCA  intervention which was done March 29, 2006, by Dr. Jenne Campus with two  CYPHER stents.  He tolerated the procedure well.  The plan is for  indefinite aspirin and Plavix.  Dr. Jenne Campus feels he can be discharged  March 30, 2006.   DISCHARGE LABORATORY:  Sodium 138, potassium 3.3, BUN 16, creatinine  1.2.  CK-MB were negative x1.  White count 5.9, hemoglobin 14.4,  hematocrit 40.5, platelets 177.  EKG shows sinus rhythm, right bundle  branch block.   DISPOSITION:  The patient discharged in stable condition and will follow  up with Dr. Jenne Campus next week in the office.   DISCHARGE MEDICATIONS:  1. HCTZ 12.5 mg a day.  2. Lyrica 75 mg a day.  3. Coated aspirin daily.  4. Plavix 75 mg a day.  5. Folic acid 1 mg a day.  6. Nitroglycerin sublingual p.r.n.  7. Vytorin 10/20 a day.  8. Lotensin 5 mg a day.  9. Metoprolol 25 mg a day.      Abelino Derrick, P.A.      Darlin Priestly, MD  Electronically Signed    LKK/MEDQ  D:  03/30/2006  T:  03/30/2006  Job:  [id number]   cc:   Ladell Pier, M.D.

## 2011-06-04 IMAGING — CT CT CTA ABD/PEL W/CM AND/OR W/O CM
2 of 8 series · 13 of 36 positions shown, 19 images · IV contrast ([ID] OMNI [id number])
Comparison: 09/20/2009

CLINICAL DATA: Abdominal aortic aneurysm, status post stent graft
repair

CT ANGIOGRAPHY ABDOMEN AND PELVIS
TECHNIQUE: Multidetector CT imaging of the abdomen and pelvis was
performed using the standard protocol during bolus administration
of intravenous contrast.  Multiplanar reconstructed images
including MIPs were obtained and reviewed to evaluate the vascular
anatomy.
Contrast:  100 ml Omnipaque 350

[Series 4: angio · axial · 0.78mm/px · z∈[-432,-78]mm · 10 of 250 slices shown, 16 images]
[im 23/250  soft-tissue]
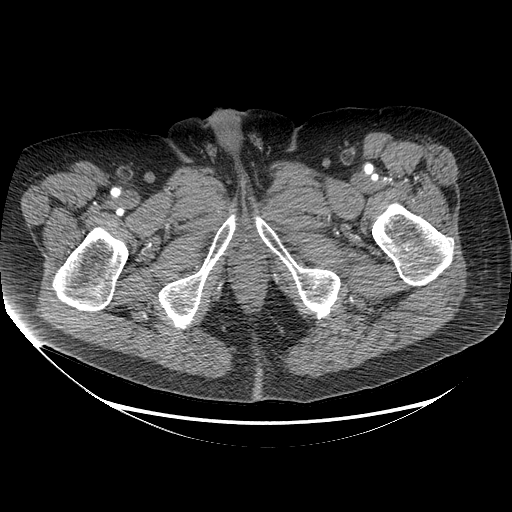
[im 23/250  bone]
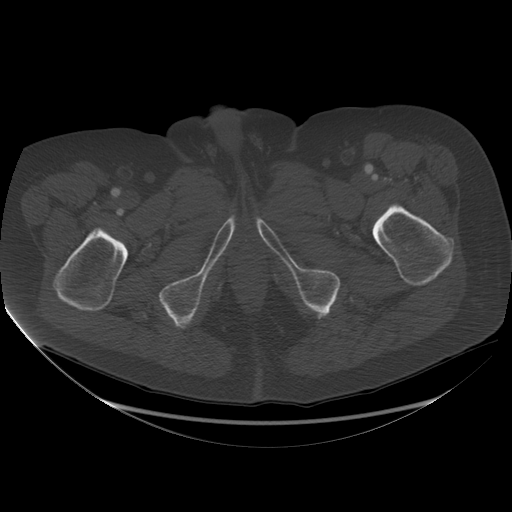
[im 46/250  soft-tissue]
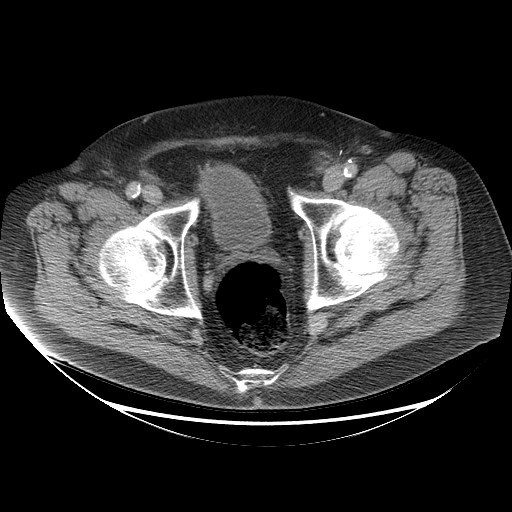
[im 68/250  soft-tissue]
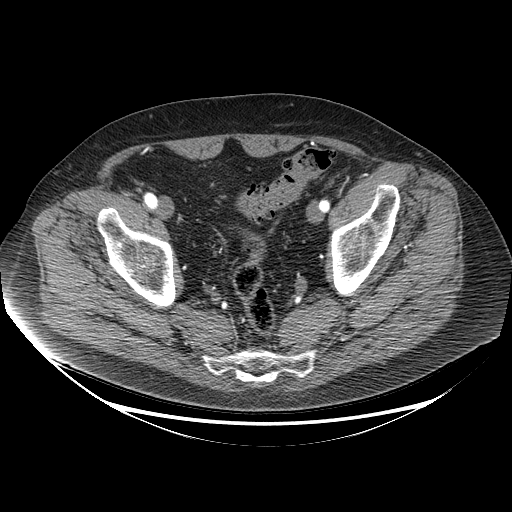
[im 91/250  soft-tissue]
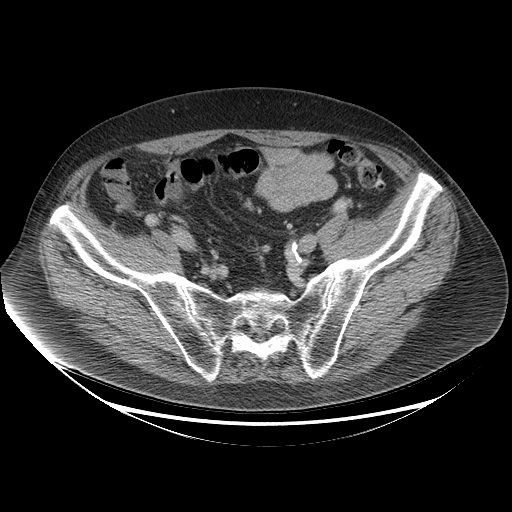
[im 114/250  soft-tissue]
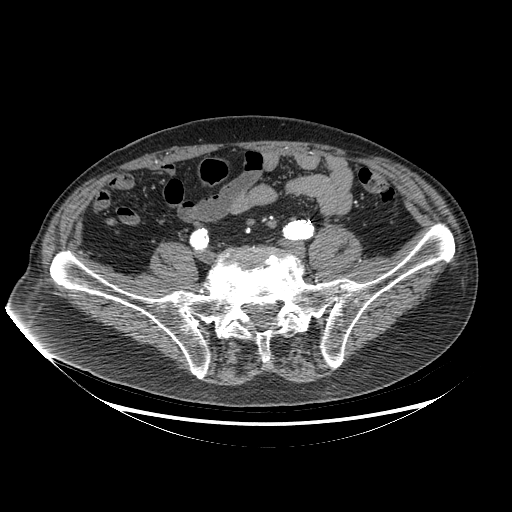
[im 136/250  soft-tissue]
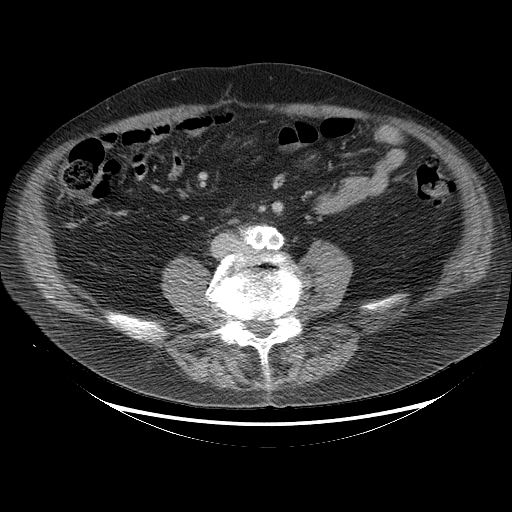
[im 159/250  soft-tissue]
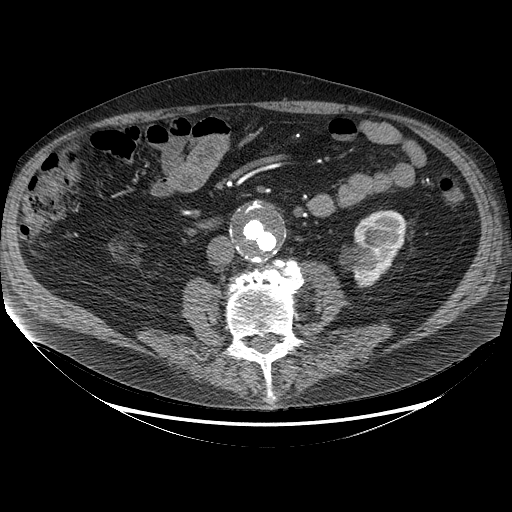
[im 159/250  lung]
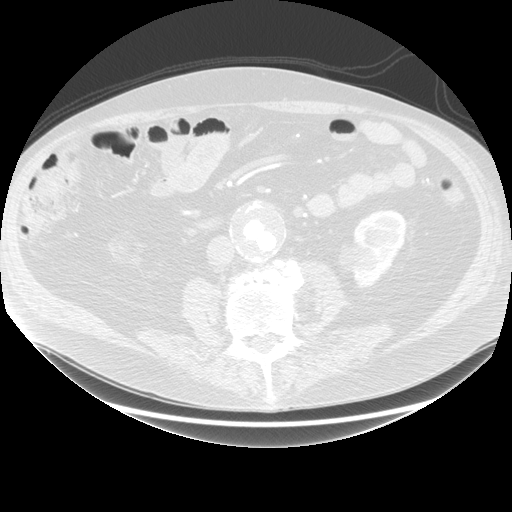
[im 182/250  soft-tissue]
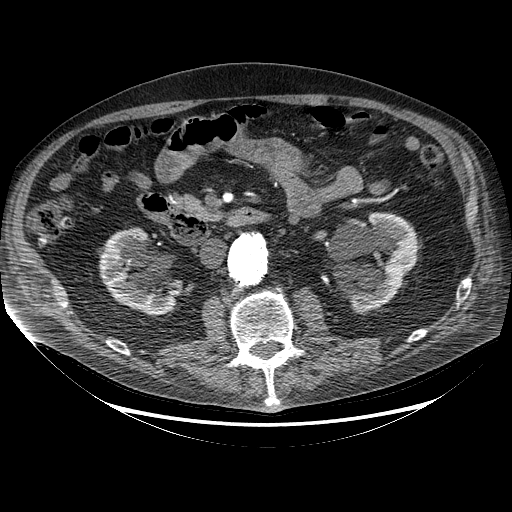
[im 182/250  lung]
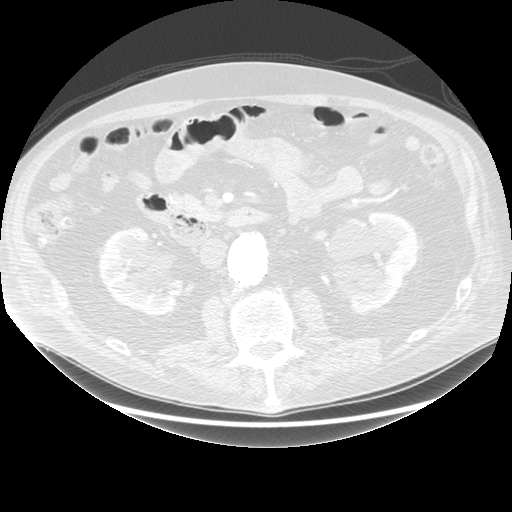
[im 204/250  soft-tissue]
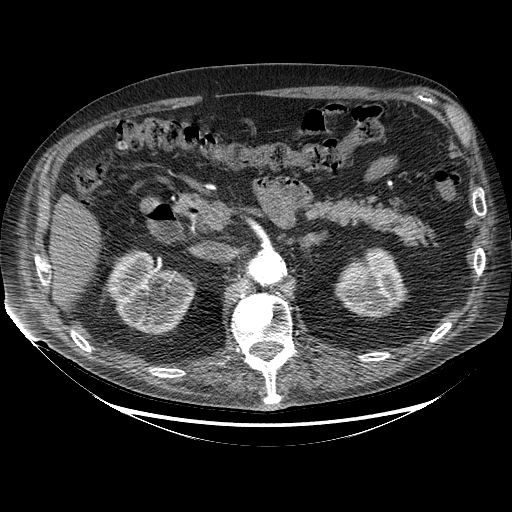
[im 204/250  lung]
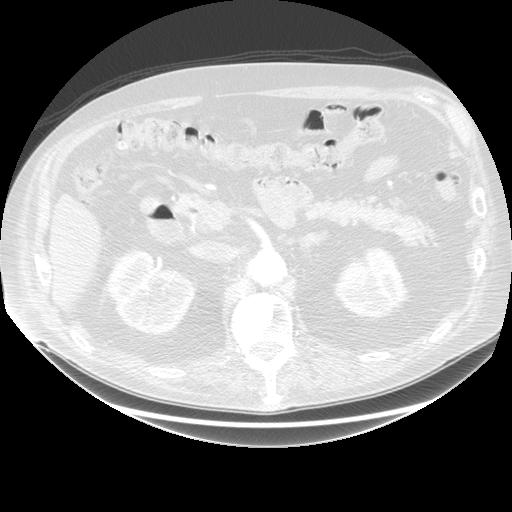
[im 204/250  bone]
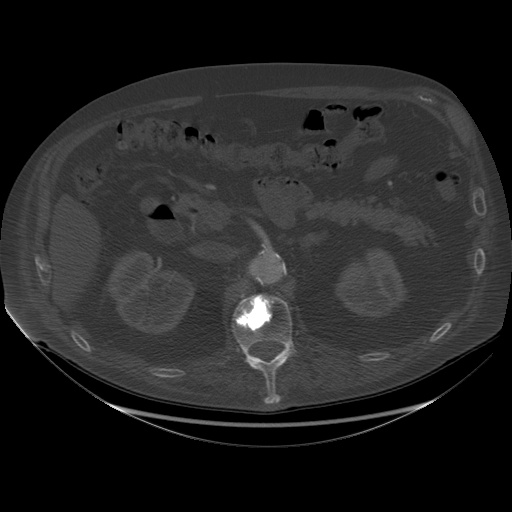
[im 227/250  soft-tissue]
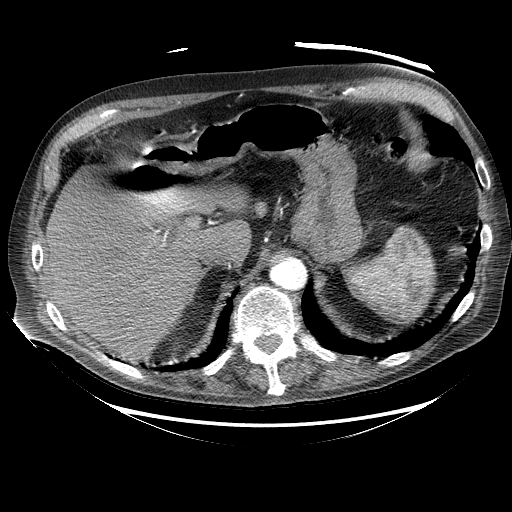
[im 227/250  lung]
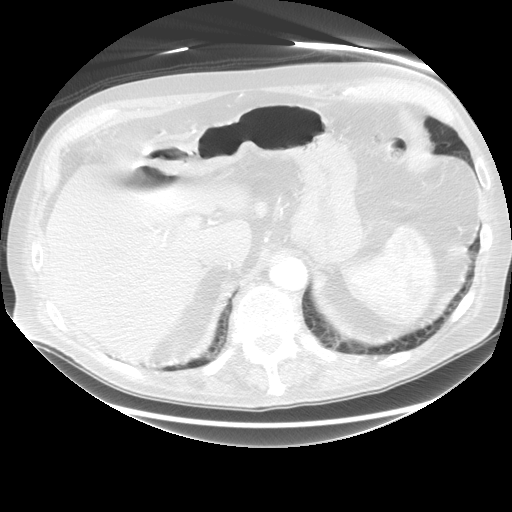

[Series 602: sagittal body · sagittal · 0.93mm/px · 3 of 161 slices shown]
[im 23/161  soft-tissue]
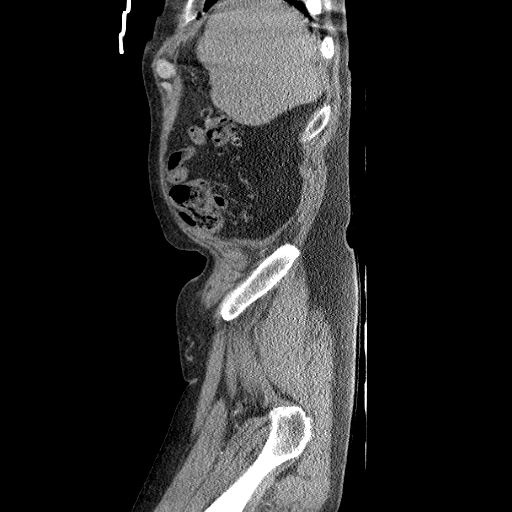
[im 46/161  soft-tissue]
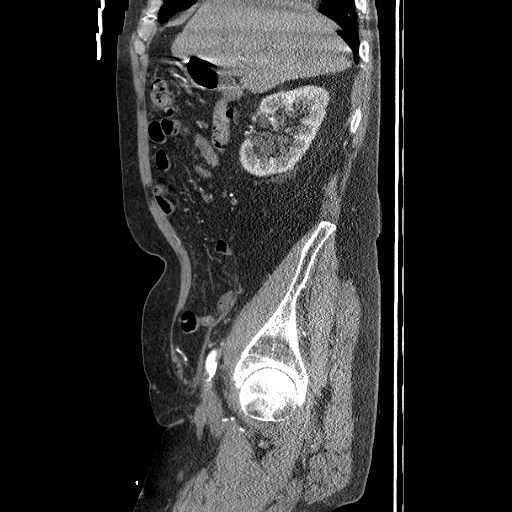
[im 69/161  soft-tissue]
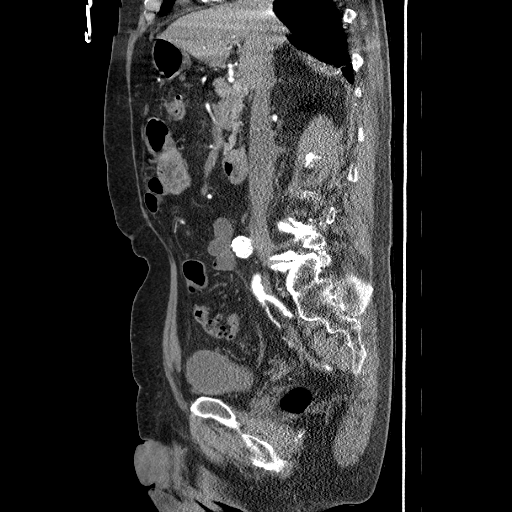

[13 of 36 positions shown; findings below may reference images not displayed]

FINDINGS: Patent infrarenal aorto bi-iliac stent graft.  No
evidence of Endo leak.  Interval decrease in the native aneurysm
ssac diameter measuring 4.4 x 4.2 cm, image 71 (previously
measuring 5.2 x 5.2 cm).  Aortic stent graft extends from just
inferior to the renal arteries into the common iliac arteries
bilaterally.  Atherosclerotic changes noted of the aorta, celiac
and SMA origins, and the renal arteries.  IMA origin is occluded
however the IMA remains reconstituted via mesenteric collaterals.
Iliac vessels remain patent and tortuous as before.

Additional axial imaging:  Normal heart size.  No pericardial or
pleural effusion.  Previous cholecystectomy noted.  Duodenal
diverticulum evident with an air-fluid level.  Pancreas, spleen,
and adrenal glands are within normal limits for early arterial
phase imaging.  Stable parapelvic cyst within both kidneys
centrally.  No bowel obstruction pattern or dilatation.  No ileus.
Diverticulosis of the colon.

Degenerative changes of the spine.  Prior L1 vertebroplasty cement
noted.

 Review of the MIP images confirms the above findings.
IMPRESSION: Stable stent graft repair of the abdominal aortic aneurysm.
Interval decrease in the native aneurysm sac diameter.  No evidence
of Endo leak.

## 2011-06-07 ENCOUNTER — Other Ambulatory Visit (INDEPENDENT_AMBULATORY_CARE_PROVIDER_SITE_OTHER): Payer: Medicare Other | Admitting: *Deleted

## 2011-06-07 DIAGNOSIS — I714 Abdominal aortic aneurysm, without rupture: Secondary | ICD-10-CM

## 2011-06-07 DIAGNOSIS — Z48812 Encounter for surgical aftercare following surgery on the circulatory system: Secondary | ICD-10-CM

## 2011-06-11 ENCOUNTER — Other Ambulatory Visit: Payer: Self-pay | Admitting: *Deleted

## 2011-06-11 DIAGNOSIS — Z48812 Encounter for surgical aftercare following surgery on the circulatory system: Secondary | ICD-10-CM

## 2011-06-11 DIAGNOSIS — I714 Abdominal aortic aneurysm, without rupture: Secondary | ICD-10-CM

## 2011-06-12 ENCOUNTER — Encounter: Payer: Self-pay | Admitting: Vascular Surgery

## 2011-06-12 NOTE — Procedures (Unsigned)
VASCULAR LAB EXAM  INDICATION:  Followup AAA endograft placed 08/25/2009  HISTORY: Diabetes:  No Cardiac:  Coronary artery disease, stenting Hypertension:  Yes  EXAM:  AAA size 3.98 cm AP, 3.86 cm transverse  Previous excise performed by CT 07/04/2010, 4.7 cm AP  IMPRESSION:  The aorta and endograft appear patent.  No significant change in size of the aneurysmal sac surrounding the endograft.  No evidence of endoleak was detected.  ___________________________________________ Quita Skye. Hart Rochester, M.D.  EM/MEDQ  D:  06/07/2011  T:  06/07/2011  Job:  [id number]

## 2011-07-05 ENCOUNTER — Other Ambulatory Visit: Payer: Medicare Other

## 2012-01-14 ENCOUNTER — Ambulatory Visit: Payer: Medicare Other

## 2012-01-14 ENCOUNTER — Ambulatory Visit (INDEPENDENT_AMBULATORY_CARE_PROVIDER_SITE_OTHER): Payer: Medicare Other | Admitting: Family Medicine

## 2012-01-14 VITALS — BP 130/63 | HR 83 | Temp 98.3°F | Resp 18 | Wt 192.0 lb

## 2012-01-14 DIAGNOSIS — M25579 Pain in unspecified ankle and joints of unspecified foot: Secondary | ICD-10-CM

## 2012-01-14 DIAGNOSIS — Z23 Encounter for immunization: Secondary | ICD-10-CM

## 2012-01-14 DIAGNOSIS — S82409A Unspecified fracture of shaft of unspecified fibula, initial encounter for closed fracture: Secondary | ICD-10-CM

## 2012-01-14 DIAGNOSIS — T07XXXA Unspecified multiple injuries, initial encounter: Secondary | ICD-10-CM

## 2012-01-14 DIAGNOSIS — M25572 Pain in left ankle and joints of left foot: Secondary | ICD-10-CM

## 2012-01-14 DIAGNOSIS — IMO0002 Reserved for concepts with insufficient information to code with codable children: Secondary | ICD-10-CM

## 2012-01-14 NOTE — Patient Instructions (Addendum)
Local care of wounds. Dress with polysporin ointment.  Cam walker  See ortho tomorrow hopefully  No weight bearing if possible  Tylenol ex strength 2 every 6 hrs as needed

## 2012-01-14 NOTE — Progress Notes (Signed)
S: Tripped on steps and fell today. Hurt right hand, right knee, and left ankle  Hx mild dementia.  O: Mild tremor.   Abrasion right knee.  Small abrasions on right hand.  Left ankle swollen and tender on malleolus and around  A: Pain ankle Abrasion of knee Small wounds hand.  P; Xray.,  Dress wounds.  TDAP  [facility] reading (PRIMARY) by  Dr. Alwyn Ren Possible fx distal fibula above malleolus.  Will send for reading..  Fracture fibula  Refer to ortho

## 2012-06-02 ENCOUNTER — Encounter: Payer: Self-pay | Admitting: Neurosurgery

## 2012-06-03 ENCOUNTER — Encounter (INDEPENDENT_AMBULATORY_CARE_PROVIDER_SITE_OTHER): Payer: Medicare Other | Admitting: *Deleted

## 2012-06-03 ENCOUNTER — Ambulatory Visit: Payer: Medicare Other | Admitting: Vascular Surgery

## 2012-06-03 ENCOUNTER — Ambulatory Visit (INDEPENDENT_AMBULATORY_CARE_PROVIDER_SITE_OTHER): Payer: Medicare Other | Admitting: Neurosurgery

## 2012-06-03 ENCOUNTER — Other Ambulatory Visit: Payer: Self-pay | Admitting: *Deleted

## 2012-06-03 ENCOUNTER — Encounter: Payer: Self-pay | Admitting: Neurosurgery

## 2012-06-03 VITALS — BP 105/67 | HR 63 | Resp 16 | Ht 74.0 in | Wt 190.0 lb

## 2012-06-03 DIAGNOSIS — I714 Abdominal aortic aneurysm, without rupture: Secondary | ICD-10-CM

## 2012-06-03 DIAGNOSIS — Z48812 Encounter for surgical aftercare following surgery on the circulatory system: Secondary | ICD-10-CM

## 2012-06-03 HISTORY — PX: OTHER SURGICAL HISTORY: SHX169

## 2012-06-03 NOTE — Progress Notes (Signed)
[facility] [place] AAA/Carotid Office Note  CC: AAA surveillance Referring Physician: Hart Rochester  History of Present Illness: 79 year old male patient of Dr. Hart Rochester status post AAA endograft repair in 2011. The patient denies any unusual abdominal or back pain. The patient denies any new medical diagnoses or recent surgery.  Past Medical History  Diagnosis Date  . AAA (abdominal aortic aneurysm)   . Hypertension   . Hyperlipidemia   . Anxiety and depression   . Peripheral neuropathy   . Coronary artery disease     Previous PTCA and stenting in 2008  . COPD (chronic obstructive pulmonary disease)     ROS: [x]  Positive   [ ]  Denies    General: [ ]  Weight loss, [ ]  Fever, [ ]  chills Neurologic: [ ]  Dizziness, [ ]  Blackouts, [ ]  Seizure [ ]  Stroke, [ ]  "Mini stroke", [ ]  Slurred speech, [ ]  Temporary blindness; [ ]  weakness in arms or legs, [ ]  Hoarseness Cardiac: [ ]  Chest pain/pressure, [ ]  Shortness of breath at rest [ ]  Shortness of breath with exertion, [ ]  Atrial fibrillation or irregular heartbeat Vascular: [ ]  Pain in legs with walking, [ ]  Pain in legs at rest, [ ]  Pain in legs at night,  [ ]  Non-healing ulcer, [ ]  Blood clot in vein/DVT,   Pulmonary: [ ]  Home oxygen, [ ]  Productive cough, [ ]  Coughing up blood, [ ]  Asthma,  [ ]  Wheezing Musculoskeletal:  [ ]  Arthritis, [ ]  Low back pain, [ ]  Joint pain Hematologic: [ ]  Easy Bruising, [ ]  Anemia; [ ]  Hepatitis Gastrointestinal: [ ]  Blood in stool, [ ]  Gastroesophageal Reflux/heartburn, [ ]  Trouble swallowing Urinary: [ ]  chronic Kidney disease, [ ]  on HD - [ ]  MWF or [ ]  TTHS, [ ]  Burning with urination, [ ]  Difficulty urinating Skin: [ ]  Rashes, [ ]  Wounds Psychological: [ ]  Anxiety, [ ]  Depression   Social History History  Substance Use Topics  . Smoking status: Former Games developer  . Smokeless tobacco: Never Used  . Alcohol Use: No    Family History Family History  Problem Relation Age of Onset  .  Cancer Mother   . Heart disease Father     Heart Disease before age 24  . Heart attack Father   . COPD Father     No Known Allergies  Current Outpatient Prescriptions  Medication Sig Dispense Refill  . aspirin 81 MG tablet Take 81 mg by mouth daily.      . clopidogrel (PLAVIX) 75 MG tablet Take 75 mg by mouth daily.      Marland Kitchen donepezil (ARICEPT) 10 MG tablet Take 10 mg by mouth at bedtime as needed.      . fish oil-omega-3 fatty acids 1000 MG capsule Take 2 g by mouth daily.      . midodrine (PROAMATINE) 2.5 MG tablet Take 2.5 mg by mouth 3 (three) times daily.      Marland Kitchen omeprazole (PRILOSEC) 20 MG capsule Take 20 mg by mouth daily.      . pantoprazole (PROTONIX) 40 MG tablet Take 40 mg by mouth daily.      . simvastatin (ZOCOR) 20 MG tablet Take 20 mg by mouth every evening.       No current facility-administered medications for this visit.    Physical Examination  Filed Vitals:   06/03/12 1010  BP: 105/67  Pulse: 63  Resp: 16    Body mass index is 24.38 kg/(m^2).  General:  WDWN in NAD Gait: Normal HEENT: WNL Eyes: Pupils equal Pulmonary: normal non-labored breathing , without Rales, rhonchi,  wheezing Cardiac: RRR, without  Murmurs, rubs or gallops; Abdomen: soft, NT, no masses Skin: no rashes, ulcers noted  Vascular Exam Pulses: Palpable femoral pulses bilaterally, there is an abdominal. Mass palpated Carotid bruits: Carotid pulses to auscultation Extremities without ischemic changes, no Gangrene , no cellulitis; no open wounds;  Musculoskeletal: no muscle wasting or atrophy   Neurologic: A&O X 3; Appropriate Affect ; SENSATION: normal; MOTOR FUNCTION:  moving all extremities equally. Speech is fluent/normal  Non-Invasive Vascular Imaging AAA duplex shows a maximum diameter of 3.8 x 4.0  which is consistent with previous exam one year ago  ASSESSMENT/PLAN: Asymptomatic patient that will followup in one year with the aortoiliac duplex. The patient's questions were  encouraged and answered he is in agreement with this plan.  Lauree Chandler ANP   Clinic MD: Early

## 2012-07-22 ENCOUNTER — Other Ambulatory Visit (HOSPITAL_COMMUNITY): Payer: Self-pay | Admitting: Cardiovascular Disease

## 2012-07-22 DIAGNOSIS — I351 Nonrheumatic aortic (valve) insufficiency: Secondary | ICD-10-CM

## 2012-07-22 DIAGNOSIS — I714 Abdominal aortic aneurysm, without rupture: Secondary | ICD-10-CM

## 2012-07-28 ENCOUNTER — Ambulatory Visit (HOSPITAL_COMMUNITY)
Admission: RE | Admit: 2012-07-28 | Discharge: 2012-07-28 | Disposition: A | Discharge status: Home / Self Care | Payer: Medicare Other | Source: Ambulatory Visit | Attending: Cardiovascular Disease | Admitting: Cardiovascular Disease

## 2012-07-28 DIAGNOSIS — I714 Abdominal aortic aneurysm, without rupture, unspecified: Secondary | ICD-10-CM | POA: Insufficient documentation

## 2012-07-28 DIAGNOSIS — I359 Nonrheumatic aortic valve disorder, unspecified: Secondary | ICD-10-CM | POA: Insufficient documentation

## 2012-07-28 DIAGNOSIS — I351 Nonrheumatic aortic (valve) insufficiency: Secondary | ICD-10-CM

## 2012-07-28 HISTORY — PX: TRANSTHORACIC ECHOCARDIOGRAM: SHX275

## 2012-07-28 NOTE — Progress Notes (Signed)
[facility]   2D echo completed 07/28/2012.   Cindy Davinia Riccardi, RDCS  

## 2012-09-12 ENCOUNTER — Encounter: Payer: Self-pay | Admitting: Cardiovascular Disease

## 2012-10-22 ENCOUNTER — Other Ambulatory Visit: Payer: Self-pay

## 2012-10-22 MED ORDER — DONEPEZIL HCL 10 MG PO TABS: 10.0000 mg | ORAL_TABLET | Freq: Every evening | ORAL | Status: DC | PRN

## 2012-10-22 NOTE — Telephone Encounter (Signed)
Rx was sent to pharmacy electronically via [employer].  

## 2013-01-28 ENCOUNTER — Encounter (HOSPITAL_COMMUNITY): Payer: Self-pay | Admitting: Emergency Medicine

## 2013-01-28 ENCOUNTER — Ambulatory Visit (INDEPENDENT_AMBULATORY_CARE_PROVIDER_SITE_OTHER): Payer: Medicare Other | Admitting: Family Medicine

## 2013-01-28 ENCOUNTER — Emergency Department (HOSPITAL_COMMUNITY)
Admission: EM | Admit: 2013-01-28 | Discharge: 2013-01-28 | Disposition: A | Discharge status: Home / Self Care | Payer: Medicare Other | Attending: Emergency Medicine | Admitting: Emergency Medicine

## 2013-01-28 ENCOUNTER — Emergency Department (HOSPITAL_COMMUNITY): Payer: Medicare Other

## 2013-01-28 VITALS — BP 142/62 | HR 74 | Temp 98.1°F | Resp 16

## 2013-01-28 DIAGNOSIS — E785 Hyperlipidemia, unspecified: Secondary | ICD-10-CM | POA: Insufficient documentation

## 2013-01-28 DIAGNOSIS — Z8669 Personal history of other diseases of the nervous system and sense organs: Secondary | ICD-10-CM | POA: Insufficient documentation

## 2013-01-28 DIAGNOSIS — Z8659 Personal history of other mental and behavioral disorders: Secondary | ICD-10-CM | POA: Insufficient documentation

## 2013-01-28 DIAGNOSIS — Z87891 Personal history of nicotine dependence: Secondary | ICD-10-CM | POA: Insufficient documentation

## 2013-01-28 DIAGNOSIS — I10 Essential (primary) hypertension: Secondary | ICD-10-CM | POA: Insufficient documentation

## 2013-01-28 DIAGNOSIS — J449 Chronic obstructive pulmonary disease, unspecified: Secondary | ICD-10-CM | POA: Insufficient documentation

## 2013-01-28 DIAGNOSIS — Y929 Unspecified place or not applicable: Secondary | ICD-10-CM | POA: Insufficient documentation

## 2013-01-28 DIAGNOSIS — Z7902 Long term (current) use of antithrombotics/antiplatelets: Secondary | ICD-10-CM | POA: Insufficient documentation

## 2013-01-28 DIAGNOSIS — Y9389 Activity, other specified: Secondary | ICD-10-CM | POA: Insufficient documentation

## 2013-01-28 DIAGNOSIS — J4489 Other specified chronic obstructive pulmonary disease: Secondary | ICD-10-CM | POA: Insufficient documentation

## 2013-01-28 DIAGNOSIS — Z7982 Long term (current) use of aspirin: Secondary | ICD-10-CM | POA: Insufficient documentation

## 2013-01-28 DIAGNOSIS — I2510 Atherosclerotic heart disease of native coronary artery without angina pectoris: Secondary | ICD-10-CM | POA: Insufficient documentation

## 2013-01-28 DIAGNOSIS — W1809XA Striking against other object with subsequent fall, initial encounter: Secondary | ICD-10-CM | POA: Insufficient documentation

## 2013-01-28 DIAGNOSIS — S0003XA Contusion of scalp, initial encounter: Secondary | ICD-10-CM | POA: Insufficient documentation

## 2013-01-28 DIAGNOSIS — S0990XA Unspecified injury of head, initial encounter: Secondary | ICD-10-CM

## 2013-01-28 NOTE — Progress Notes (Signed)
  Subjective:    Patient ID: Alexander Randolph, male    DOB: February 21, 1932, 79 y.o.   MRN: [id number]  HPI Patient is a 79 y/o male with PMH of dementia and CAD who presents for evaluation of head injury. He is on plavix for history of CAD stents. Fell about 5pm and hit head. Hit head on brick fireplace. Has been acting at baseline with no increased confusion or balance problems. Denies headache, dizziness, nausea, neck pain.  Review of Systems  Neurological: Negative for dizziness, facial asymmetry, speech difficulty, weakness, light-headedness, numbness and headaches.  All other systems reviewed and are negative.      Objective:   Physical Exam  Constitutional: He appears well-developed and well-nourished. No distress.  HENT:  Head: Normocephalic.  Eyes: Conjunctivae and EOM are normal. Pupils are equal, round, and reactive to light. No scleral icterus.  Neck: Normal range of motion.  Cardiovascular: Normal rate and regular rhythm.   Pulmonary/Chest: Effort normal and breath sounds normal.  Musculoskeletal: Normal range of motion.  Lymphadenopathy:    He has no cervical adenopathy.  Skin: Skin is warm. He is not diaphoretic.  Skin: Small abrasion on back of occiput with contusion and hematoma. No open laceration or active bleeding. There is also a small abrasion over the right neck. No sutures needed. Hematoma is approximately 3 cm in diameter Neuro: CN2-12 intact. FROM of extremities. Strength 5/5 in BLE and BUE. Sensation intact to light touch. Normal finger to nose. Neg romberg. Neg pronator drift. Oriented to self, location, situation but not to date    Assessment & Plan:  #1. Head injury in elderly patient on plavix. - No clinical exam findings to suggest brain bleed at this time - High risk of intracranial bleed given on blood thinners - Will send to ER - Advised transport by ambulance. However, patient's wife refuses EMS transport. We discussed the risks of personal transport  including risk of bleed with developing neurologic symptoms and consequences up to and including clinical deterioration, permanent neurological injury, and death. - No lacs that require suturing at this time. Recommend skin care and neosporin.

## 2013-01-28 NOTE — ED Notes (Addendum)
Pt reports having a fall around 1700 today after falling over a dog bed while ambulating to the rest room. Pt/spouse went to PCP and pt was referred to ED for CT of his head due to be on anticoagulant therapy. Family member reports no cognitive decline since 1700. Pt is A/O x4, in NAD, and vital signs are WDL, pt denies pain.

## 2013-01-28 NOTE — Patient Instructions (Signed)
Thank you for coming in today Since Bennett hit his head and is on blood thinners, he needs to get a CT scan of his head in the ER We will call the ER and make sure they know you are coming We would recommend that Bush be transported by EMS in case his condition worsens but you may transport him as long as you understand the risk that he may get worse while he is in the car with you I do not think that he needs stitches. Clean wound and use neosporin.

## 2013-01-28 NOTE — ED Provider Notes (Signed)
CSN: [id number]     Arrival date & time 01/28/13  1833 History   First MD Initiated Contact with Patient 01/28/13 1850     Chief Complaint  Patient presents with  . Fall   (Consider location/radiation/quality/duration/timing/severity/associated sxs/prior Treatment) HPI  This is a 79 year old male who presents following a fall. Patient reports falling earlier today after tripping over her dog. He hit his head but denies loss of consciousness. He does take aspirin and Plavix. Per his wife, he been acting normally and is at his baseline. He denies any nausea or vomiting. He was seen in urgent care and told to come here for a CT scan.  He denies any other injury at this time.  Past Medical History  Diagnosis Date  . AAA (abdominal aortic aneurysm)   . Hypertension   . Hyperlipidemia   . Anxiety and depression   . Peripheral neuropathy   . Coronary artery disease     Previous PTCA and stenting in 2008  . COPD (chronic obstructive pulmonary disease)    Past Surgical History  Procedure Laterality Date  . Abdominal aortic aneurysm repair  Aug 25, 2009    Endograft placed    Family History  Problem Relation Age of Onset  . Cancer Mother   . Heart disease Father     Heart Disease before age 70  . Heart attack Father   . COPD Father    History  Substance Use Topics  . Smoking status: Former Games developer  . Smokeless tobacco: Never Used  . Alcohol Use: No    Review of Systems  Constitutional: Negative for fever.  Respiratory: Negative for shortness of breath.   Cardiovascular: Negative for chest pain.  Gastrointestinal: Negative for abdominal pain.  Musculoskeletal: Negative for gait problem.  Skin: Positive for wound.  Neurological: Negative for speech difficulty, weakness, light-headedness and headaches.    Allergies  Review of patient's allergies indicates no known allergies.  Home Medications   Current Outpatient Rx  Name  Route  Sig  Dispense  Refill  . aspirin 81 MG  tablet   Oral   Take 81 mg by mouth daily.         . clopidogrel (PLAVIX) 75 MG tablet   Oral   Take 75 mg by mouth daily.         Marland Kitchen donepezil (ARICEPT) 10 MG tablet   Oral   Take 1 tablet (10 mg total) by mouth at bedtime as needed.   90 tablet   3   . fish oil-omega-3 fatty acids 1000 MG capsule   Oral   Take 2 g by mouth daily.         . midodrine (PROAMATINE) 2.5 MG tablet   Oral   Take 2.5 mg by mouth 3 (three) times daily.         Marland Kitchen omeprazole (PRILOSEC) 20 MG capsule   Oral   Take 20 mg by mouth daily.         . pantoprazole (PROTONIX) 40 MG tablet   Oral   Take 40 mg by mouth daily.         . simvastatin (ZOCOR) 20 MG tablet   Oral   Take 20 mg by mouth every evening.          BP 129/77  Pulse 68  Temp(Src) 97.8 F (36.6 C) (Oral)  Resp 18  SpO2 97% Physical Exam  Nursing note and vitals reviewed. Constitutional: He is oriented to person, place, and time.  He appears well-developed and well-nourished. No distress.  HENT:  Head: Normocephalic.  Quarter-sized hematoma to the occiput with small overlying abrasion, bleeding controlled  Eyes: EOM are normal. Pupils are equal, round, and reactive to light.  Neck: Neck supple.  Cardiovascular: Normal rate, regular rhythm and normal heart sounds.   No murmur heard. Pulmonary/Chest: Effort normal and breath sounds normal. No respiratory distress. He has no wheezes.  Abdominal: Soft. Bowel sounds are normal. There is no tenderness. There is no rebound.  Musculoskeletal: He exhibits no edema.  Lymphadenopathy:    He has no cervical adenopathy.  Neurological: He is alert and oriented to person, place, and time. No cranial nerve deficit.  Normal gait, deep tendon reflexes normal, 5 out of 5 extremity strength  Skin: Skin is warm and dry.  Psychiatric: He has a normal mood and affect.    ED Course  Procedures (including critical care time) Labs Review Labs Reviewed - No data to display Imaging  Review Ct Head Wo Contrast  01/28/2013   CLINICAL DATA:  Fall, head pain. Cognitive decline. Patient is anticoagulated.  EXAM: CT HEAD WITHOUT CONTRAST  TECHNIQUE: Contiguous axial images were obtained from the base of the skull through the vertex without contrast.  COMPARISON:  10/15/2008.  FINDINGS: No evidence for acute infarction, hemorrhage, mass lesion, hydrocephalus, or extra-axial fluid. Moderate atrophy. Extensive chronic microvascular ischemic change affecting the periventricular and subcortical white matter.  Large left parietal paramedian scalp hematoma. No underlying skull fracture. No contrecoup injury. Clear sinuses. Negative mastoids. No orbital abnormality.  Compared with priors, the atrophy and small vessel disease has mildly progressed.  IMPRESSION: Moderate atrophy with extensive chronic microvascular ischemic change, progressive since 2010.  Large left parietal paramedian scalp hematoma without underlying skull fracture or intracranial hemorrhage   Electronically Signed   By: Davonna Belling M.D.   On: 01/28/2013 20:34    EKG Interpretation   None       MDM   1. Scalp hematoma, initial encounter    This a 79 year old male who presents from urgent care with concerns for scalp hematoma and head injury in the setting of anticoagulant use. He is nontoxic-appearing and nonfocal on exam. Vital signs are reassuring. He does have a occipital scalp hematoma with overlying abrasion. Given his Plavix usage, CT scan of the head was obtained.  I was called to the room multiple times as the patient and his wife for "ready to leave."  I discussed with them and I felt strongly that he needs a CT scan to rule out acute bleed. They agree to stay to get a CT scan. CT is negative for acute bleed. Patient wishes to be discharged home and does not wish to be observed. He was given return precautions.  After history, exam, and medical workup I feel the patient has been appropriately medically screened  and is safe for discharge home. Pertinent diagnoses were discussed with the patient. Patient was given return precautions.    Shon Baton, MD 01/28/13 [phone]

## 2013-01-30 NOTE — Progress Notes (Signed)
patient discussed with Dr. Voss. Agree with assessment and plan of care per her note.   

## 2013-02-09 ENCOUNTER — Other Ambulatory Visit: Payer: Self-pay | Admitting: *Deleted

## 2013-02-09 MED ORDER — CLOPIDOGREL BISULFATE 75 MG PO TABS: 75.0000 mg | ORAL_TABLET | Freq: Every day | ORAL | Status: DC

## 2013-06-08 ENCOUNTER — Encounter: Payer: Self-pay | Admitting: Family

## 2013-06-09 ENCOUNTER — Other Ambulatory Visit (HOSPITAL_COMMUNITY): Payer: Medicare Other

## 2013-06-09 ENCOUNTER — Ambulatory Visit: Payer: Medicare Other | Admitting: Family

## 2013-06-23 ENCOUNTER — Other Ambulatory Visit: Payer: Self-pay | Admitting: Cardiovascular Disease

## 2013-07-08 ENCOUNTER — Encounter: Payer: Self-pay | Admitting: Family

## 2013-07-09 ENCOUNTER — Ambulatory Visit (INDEPENDENT_AMBULATORY_CARE_PROVIDER_SITE_OTHER): Payer: Medicare Other | Admitting: Family

## 2013-07-09 ENCOUNTER — Ambulatory Visit (HOSPITAL_COMMUNITY)
Admission: RE | Admit: 2013-07-09 | Discharge: 2013-07-09 | Disposition: A | Discharge status: Home / Self Care | Payer: Medicare Other | Source: Ambulatory Visit | Attending: Family | Admitting: Family

## 2013-07-09 ENCOUNTER — Encounter: Payer: Self-pay | Admitting: Family

## 2013-07-09 VITALS — BP 95/65 | HR 70 | Resp 16 | Ht 74.5 in | Wt 193.0 lb

## 2013-07-09 DIAGNOSIS — I714 Abdominal aortic aneurysm, without rupture, unspecified: Secondary | ICD-10-CM

## 2013-07-09 DIAGNOSIS — Z48812 Encounter for surgical aftercare following surgery on the circulatory system: Secondary | ICD-10-CM | POA: Insufficient documentation

## 2013-07-09 NOTE — Addendum Note (Signed)
Addended by: Sharee PimpleMCCHESNEY, MARILYN K on: 07/09/2013 12:24 PM   Modules accepted: Orders

## 2013-07-09 NOTE — Patient Instructions (Signed)

## 2013-07-09 NOTE — Progress Notes (Signed)
[facility] [place]  Established EV[facility]  History of Present Illness  Alexander Randolph is a 80 y.o. (12/31/1931) male patient of Dr. Hart Rochester status post AAA endograft repair in 2011 who presents for routine follow up s/p EVAR.  The patient denies back or abdominal pain. He denies claudication symptoms, denies non healing wounds in feet/legs. He takes daily ASA , Plavix, and a statin.  Pt Diabetic: No Pt smoker: former smoker, quit 30 years ago   Past Medical History  Diagnosis Date  . AAA (abdominal aortic aneurysm)   . Hypertension   . Hyperlipidemia   . Anxiety and depression   . Peripheral neuropathy   . Coronary artery disease     Previous PTCA and stenting in 2008  . COPD (chronic obstructive pulmonary disease)    Past Surgical History  Procedure Laterality Date  . Abdominal aortic aneurysm repair  Aug 25, 2009    Endograft placed - Dr. Quita Skye. Hart Rochester   Social History History  Substance Use Topics  . Smoking status: Former Games developer  . Smokeless tobacco: Never Used  . Alcohol Use: No   Family History Family History  Problem Relation Age of Onset  . Cancer Mother   . Heart disease Father     Heart Disease before age 83  . Heart attack Father   . COPD Father    Current Outpatient Prescriptions on File Prior to Visit  Medication Sig Dispense Refill  . aspirin 81 MG tablet Take 81 mg by mouth daily.      . clopidogrel (PLAVIX) 75 MG tablet TAKE 1 TABLET (75 MG TOTAL) BY MOUTH DAILY.  30 tablet  0  . midodrine (PROAMATINE) 2.5 MG tablet Take 2.5 mg by mouth 3 (three) times daily.      Marland Kitchen omeprazole (PRILOSEC) 20 MG capsule Take 20 mg by mouth daily.      . pantoprazole (PROTONIX) 40 MG tablet Take 40 mg by mouth daily.      . simvastatin (ZOCOR) 20 MG tablet Take 20 mg by mouth every evening.      . donepezil (ARICEPT) 10 MG tablet Take 1 tablet (10 mg total) by mouth at bedtime as needed.  90 tablet  3  . fish oil-omega-3 fatty acids 1000 MG  capsule Take 2 g by mouth daily.       No current facility-administered medications on file prior to visit.   No Known Allergies   ROS: See HPI for pertinent positives and negatives.  Physical Examination  Filed Vitals:   07/09/13 1008  BP: 95/65  Pulse: 70  Resp: 16  Height: 6' 2.5" (1.892 m)  Weight: 193 lb (87.544 kg)  SpO2: 99%   Body mass index is 24.46 kg/(m^2).  General: A&O x 3, WD.  Pulmonary: Sym exp, good air movt, CTAB, no rales, rhonchi, or wheezing.   Cardiac: RRR, Nl S1, S2, no detected  Murmurs.  Vascular: Vessel Right Left  Radial Palpable Palpable  Carotid  without bruit  without bruit  Aorta Not palpable N/A  Popliteal Not palpable Not palpable  PT notPalpable notPalpable  DP Palpable Palpable   Gastrointestinal: soft, NTND, -G/R, - HSM, - masses, - CVAT B.  Musculoskeletal: M/S 5/5 throughout, Extremities without ischemic changes.  Neurologic: Pain and light touch intact in extremities, Motor exam as listed above. Pill rolling movement of right hand at rest.  Non-Invasive Vascular Imaging  EVAR Duplex (Date: 07/09/2013)  AAA sac size: 3.93 cm x 3.54 cam  no endoleak detected   Medical Decision Making  Lyn HenriWalter H Melnyk is a 80 y.o. male who presents s/p EVAR.  Pt is asymptomatic with decreased sac size.  I discussed with the patient the importance of surveillance of the endograft.  The next endograft duplex will be scheduled for 12 months.  The patient will follow up with us in 12 months with these studies.  I emphasized the importance of maximal medical management including strict control of blood pressure, blood glucose, and lipid levels, antiplatelet agents, obtaining regular exercise, and cessation of smoking.   The patient was given information about AAA including signs, symptoms, treatment, and how to minimize the risk of enlargement and rupture of aneurysms.    Thank you for allowing us to participate in this patient's  care.  Charisse MarchSuzanne Nickel, RN, MSN, FNP-C [facility] [place] Office: [phone]  Clinic Physician: Darrick PennaFields  07/09/2013, 10:37 AM

## 2013-07-14 ENCOUNTER — Ambulatory Visit: Payer: Medicare Other | Admitting: Cardiology

## 2013-07-23 ENCOUNTER — Encounter: Payer: Self-pay | Admitting: *Deleted

## 2013-07-27 ENCOUNTER — Ambulatory Visit (INDEPENDENT_AMBULATORY_CARE_PROVIDER_SITE_OTHER): Payer: Medicare Other | Admitting: Cardiology

## 2013-07-27 ENCOUNTER — Encounter: Payer: Self-pay | Admitting: Cardiology

## 2013-07-27 VITALS — BP 100/62 | HR 75 | Ht 74.0 in | Wt 193.2 lb

## 2013-07-27 DIAGNOSIS — I2510 Atherosclerotic heart disease of native coronary artery without angina pectoris: Secondary | ICD-10-CM

## 2013-07-27 DIAGNOSIS — Z9861 Coronary angioplasty status: Secondary | ICD-10-CM

## 2013-07-27 DIAGNOSIS — I714 Abdominal aortic aneurysm, without rupture, unspecified: Secondary | ICD-10-CM

## 2013-07-27 DIAGNOSIS — I951 Orthostatic hypotension: Secondary | ICD-10-CM

## 2013-07-27 DIAGNOSIS — I739 Peripheral vascular disease, unspecified: Secondary | ICD-10-CM

## 2013-07-27 DIAGNOSIS — E785 Hyperlipidemia, unspecified: Secondary | ICD-10-CM

## 2013-07-27 DIAGNOSIS — I10 Essential (primary) hypertension: Secondary | ICD-10-CM

## 2013-07-27 NOTE — Patient Instructions (Signed)
CONTINUE WITH CURRENT MEDICATION  Your physician wants you to follow-up in 12 months Dr Herbie BaltimoreHarding-  30 min appointment.  You will receive a reminder letter in the mail two months in advance. If you don't receive a letter, please call our office to schedule the follow-up appointment.

## 2013-08-01 ENCOUNTER — Encounter: Payer: Self-pay | Admitting: Cardiology

## 2013-08-01 DIAGNOSIS — I951 Orthostatic hypotension: Secondary | ICD-10-CM | POA: Insufficient documentation

## 2013-08-01 DIAGNOSIS — I739 Peripheral vascular disease, unspecified: Secondary | ICD-10-CM | POA: Insufficient documentation

## 2013-08-01 DIAGNOSIS — I10 Essential (primary) hypertension: Secondary | ICD-10-CM | POA: Insufficient documentation

## 2013-08-01 DIAGNOSIS — Z9861 Coronary angioplasty status: Principal | ICD-10-CM

## 2013-08-01 DIAGNOSIS — E785 Hyperlipidemia, unspecified: Secondary | ICD-10-CM | POA: Insufficient documentation

## 2013-08-01 DIAGNOSIS — I2510 Atherosclerotic heart disease of native coronary artery without angina pectoris: Secondary | ICD-10-CM | POA: Insufficient documentation

## 2013-08-01 NOTE — Progress Notes (Signed)
PATIENT: Alexander HenriWalter H Conwell MRN: [id number] DOB: [birth year] PCP: Gaspar GarbeISOVEC,RICHARD W, MD  Clinic Note: Chief Complaint  Patient presents with  . Annual Exam    no chest pain ,no sob ,no edema , dementia , information given by wife    HPI: Alexander Randolph is a 80 y.o. male with a PMH below who presents today for establishment of cardiology care to retirement of Dr. Alanda AmassWeintraub. 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 He still wears his [employer] hat. Unfortunately he has developed relatively significant dementia, and is therefore a very poor historian. He has a history of CAD, PVD with AAA status post EVAR. He is now more bothered by orthostatic hypotension that is by hypertension. He has been relatively stable over the last few years for cardiac cardiac standpoint.  Interval History: Presents today with no major complaints. He is not overly active but does walk in the park some when they have helped. He denies any symptoms and his wife is not note that he has had any symptoms of chest discomfort or dyspnea with rest or exertion. No mention of any palpitations or irregular heartbeats. Has been no recent TIA or amaurosis fugax symptoms. No syncope or near-syncope. He is somewhat unsteady with positioning especially with going from sitting to standing, but it seems to be doing better on midodrine. No melena, hematochezia, hematuria, epistaxis. No complaints of symptoms consistent with claudication.  Past Medical History  Diagnosis Date  . Hypertension   . Hyperlipidemia   . Anxiety and depression   . Peripheral neuropathy   . CAD S/P percutaneous coronary angioplasty     DES to RCA in 2007; subtotal occlusion of circumflex treated medically.  Marland Kitchen. COPD (chronic obstructive pulmonary disease)   . Dementia     severe organic brain syndrome; potentially exacerbated by alcoholism.  Marland Kitchen. History of ETOH abuse   . AAA (abdominal aortic aneurysm) 06/2009    ELG by Dr  Hart RochesterLawson - gore aortobifem graft  with common iliac extender and prox extension  cuff of th EVAR.  Marland Kitchen. History of fracture of fibula 2010    TREATED BY Dr Ranell PatrickNorris  . Orthostatic hypotension     Suspected of having Shy-Drager synrome in the past.  . PVD (peripheral vascular disease)     Prior Cardiac Evaluation and Past Surgical History: Past Surgical History  Procedure Laterality Date  . Nm myoview ltd  07/20/2009    low risk scan; EF 63%  . Cardiac catheterization  03/22/2006    95% DISTAL PORTION OF RCA; MODERATE INFRARENAL ANEURYSM,EF 50% WUTH INFERIOR HYPOKINESIS  . Percutaneous coronary stent intervention (pci-s)  03/30/2006    RCA -mid PCI- Cypher DES stent 2.5 mm x 18 mm, RCA-proximal -PCI - CYPHER DES STENT 2.5 mm x 23 mm  . Transthoracic echocardiogram  07/28/2012    LV EF 50-55%  . Abd aortc duplex   06/03/2012    AORTA AND ENDOGRAFT PATENT  . Abdominal  aorta doppler  06/16/2009    INFRARENAL AORTA  DILATATION TRANVERSE 5.5 x 5.3 x 5.0;patient refused CT  patient saw Dr Alanda AmassWeintraub 07/01/2009  . Abdominal aortic aneurysm repair  Aug 25, 2009    Endograft placed - Dr. Quita SkyeJames D. Lawson,--Gore aortobifem graft with common iliac extender and proximal extension cuff of the EVAR    No Known Allergies  Current Outpatient Prescriptions  Medication Sig Dispense Refill  . aspirin 81 MG tablet Take 81 mg by mouth  daily.      . clopidogrel (PLAVIX) 75 MG tablet TAKE 1 TABLET (75 MG TOTAL) BY MOUTH DAILY.  30 tablet  0  . Memantine HCl ER (NAMENDA XR) 28 MG CP24 Take by mouth.      . midodrine (PROAMATINE) 2.5 MG tablet Take 2.5 mg by mouth daily.       . pantoprazole (PROTONIX) 40 MG tablet Take 40 mg by mouth daily.      . simvastatin (ZOCOR) 40 MG tablet Take 40 mg by mouth daily.       No current facility-administered medications for this visit.    History   Social History Narrative   Married father of 3, 5 GC. Quit smoking in 1979. Remote history of significant EtOH, quit many  years ago.  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 He is mildly active, but now limited based on his worsening dementia.   Is what has helped her roughly 4-6 hours a day for 4 days a week. He does not currently want her, but his mental status is slowly deteriorating.    Family History:  COPD in his father; Cancer in his mother; Heart attack in his father; Heart disease in his father.  ROS: A comprehensive Review of Systems - Negative except Stable dementia, mild tremor of the right hand.  PHYSICAL EXAM BP 100/62  Pulse 75  Ht 6\' 2"  (1.88 m)  Wt 193 lb 3.2 oz (87.635 kg)  BMI 24.79 kg/m2 General appearance: He is alert and oriented to himself and knows his wife. He can tell all about his past history but short-term recall is very poor. He is generally healthy appearing, and sometimes inappropriate with behavior. Neck: no adenopathy, no carotid bruit, no JVD and supple, symmetrical, trachea midline Lungs: clear to auscultation bilaterally, normal percussion bilaterally and Mild interstitial sounds but otherwise nonlabored with good air movement. Heart: RRR, normal S1 and split S2, soft SEM at RUSB, no other R./G.; nondisplaced PMI. Abdomen: soft, non-tender; bowel sounds normal; no masses,  no organomegaly Extremities: No clubbing or cyanosis, mild varicosities. Mildly diminished but palpable DP and PT pulses bilaterally. Pulses: Radial pulses 2+, and DP and PT pulses 1+ bilaterally Neurologic: Mental status: alertness: alert, orientation: person, place, Wife, affect: normal, Symptoms does it appropriate things that he interprets as funny such as pressing the knuckle of his opposite hand into the back of my hand when giving the hands shake Cranial nerves: normal   Adult ECG Report  Rate: 75 ;  Rhythm: normal sinus rhythm and that we RBBB, first-degree AV block with RAD.   Narrative Interpretation: No  significant change  Recent Labs: None available  ASSESSMENT / PLAN: Relatively stable elderly gentleman with remote history of CAD status post PCI that is relatively stable. He also has AAA appears that is stable. No active symptoms, with no plans for significant invasive or noninvasive evaluation.  CAD S/P percutaneous coronary angioplasty No active symptoms of angina or heart failure. He remains on aspirin plus Plavix. He is on statin. Because of his orthostatic hypotension he is not on a beta blocker or ACE inhibitor.  Abdominal aneurysm without mention of rupture Followed by Dr. Hart RochesterLawson.  PVD (peripheral vascular disease) Mostly involving the upper like vessels in the same process of his AAA. No active claudication symptoms. On statin plus Plavix.  Orthostatic hypotension On midodrine, less symptomatic from a stability standpoint  Hypertension  No longer an issue with more of a hypotension concern. In October your blood pressure 140/60, today is 100/60 in last month was 95/65. Not currently on any medications as he is on midodrine.  Hyperlipidemia As far as I can tell, this is being monitored by his PCP.   No orders of the defined types were placed in this encounter.   Meds ordered this encounter  Medications  . Memantine HCl ER (NAMENDA XR) 28 MG CP24    Sig: Take by mouth.  . simvastatin (ZOCOR) 40 MG tablet    Sig: Take 40 mg by mouth daily.    Followup:  12 months  Keiona Jenison W. Herbie Baltimore, M.D., M.S. Interventional Cardiolgy [facility]

## 2013-08-01 NOTE — Assessment & Plan Note (Signed)
Mostly involving the upper like vessels in the same process of his AAA. No active claudication symptoms. On statin plus Plavix.

## 2013-08-01 NOTE — Assessment & Plan Note (Signed)
On midodrine, less symptomatic from a stability standpoint

## 2013-08-01 NOTE — Assessment & Plan Note (Signed)
No longer an issue with more of a hypotension concern. In October your blood pressure 140/60, today is 100/60 in last month was 95/65. Not currently on any medications as he is on midodrine.

## 2013-08-01 NOTE — Assessment & Plan Note (Signed)
As far as I can tell, this is being monitored by his PCP.

## 2013-08-01 NOTE — Assessment & Plan Note (Signed)
No active symptoms of angina or heart failure. He remains on aspirin plus Plavix. He is on statin. Because of his orthostatic hypotension he is not on a beta blocker or ACE inhibitor.

## 2013-08-01 NOTE — Assessment & Plan Note (Signed)
Followed by Dr. Lawson. 

## 2013-08-07 ENCOUNTER — Other Ambulatory Visit: Payer: Self-pay | Admitting: *Deleted

## 2013-08-07 MED ORDER — CLOPIDOGREL BISULFATE 75 MG PO TABS: 75.0000 mg | ORAL_TABLET | Freq: Once | ORAL | Status: DC

## 2013-08-07 NOTE — Telephone Encounter (Signed)
Rx refill sent to patient pharmacy   

## 2013-08-10 ENCOUNTER — Other Ambulatory Visit: Payer: Self-pay | Admitting: *Deleted

## 2013-10-23 ENCOUNTER — Telehealth: Payer: Self-pay | Admitting: Cardiology

## 2013-10-23 NOTE — Telephone Encounter (Signed)
Left message to call back  

## 2013-10-23 NOTE — Telephone Encounter (Signed)
Pt just picked up his medicine and they gave him his medicine list. She needs to go over his list with you.

## 2013-11-05 NOTE — Telephone Encounter (Signed)
Spoke to wife. She will pick up current medication list today. QUICK DISCLOSURE DONE

## 2014-01-25 ENCOUNTER — Ambulatory Visit (HOSPITAL_COMMUNITY)
Admission: RE | Admit: 2014-01-25 | Discharge: 2014-01-25 | Disposition: A | Discharge status: Home / Self Care | Payer: Medicare Other | Source: Ambulatory Visit | Attending: Surgery | Admitting: Surgery

## 2014-01-25 ENCOUNTER — Other Ambulatory Visit (HOSPITAL_COMMUNITY): Payer: Self-pay | Admitting: Internal Medicine

## 2014-01-25 DIAGNOSIS — M79661 Pain in right lower leg: Secondary | ICD-10-CM | POA: Insufficient documentation

## 2014-01-25 DIAGNOSIS — M7989 Other specified soft tissue disorders: Secondary | ICD-10-CM

## 2014-01-25 NOTE — Progress Notes (Signed)
VASCULAR LAB PRELIMINARY  PRELIMINARY  PRELIMINARY  PRELIMINARY  Right lower extremity venous duplex completed.    Preliminary report:  Right:  No evidence of DVT, superficial thrombosis, or Baker's cyst.  Ashlie Mcmenamy, RVT 01/25/2014, 5:47 PM

## 2014-07-13 ENCOUNTER — Other Ambulatory Visit (HOSPITAL_COMMUNITY): Payer: Medicare Other

## 2014-07-13 ENCOUNTER — Ambulatory Visit: Payer: Medicare Other | Admitting: Family

## 2014-08-13 ENCOUNTER — Encounter (HOSPITAL_COMMUNITY): Payer: Self-pay | Admitting: Emergency Medicine

## 2014-08-13 ENCOUNTER — Emergency Department (HOSPITAL_COMMUNITY)
Admission: EM | Admit: 2014-08-13 | Discharge: 2014-08-13 | Disposition: A | Discharge status: Home / Self Care | Payer: Medicare Other | Attending: Emergency Medicine | Admitting: Emergency Medicine

## 2014-08-13 DIAGNOSIS — Z8781 Personal history of (healed) traumatic fracture: Secondary | ICD-10-CM | POA: Insufficient documentation

## 2014-08-13 DIAGNOSIS — F0390 Unspecified dementia without behavioral disturbance: Secondary | ICD-10-CM | POA: Insufficient documentation

## 2014-08-13 DIAGNOSIS — Z7901 Long term (current) use of anticoagulants: Secondary | ICD-10-CM | POA: Insufficient documentation

## 2014-08-13 DIAGNOSIS — Z87891 Personal history of nicotine dependence: Secondary | ICD-10-CM | POA: Diagnosis not present

## 2014-08-13 DIAGNOSIS — Z7982 Long term (current) use of aspirin: Secondary | ICD-10-CM | POA: Insufficient documentation

## 2014-08-13 DIAGNOSIS — Z8669 Personal history of other diseases of the nervous system and sense organs: Secondary | ICD-10-CM | POA: Insufficient documentation

## 2014-08-13 DIAGNOSIS — I951 Orthostatic hypotension: Secondary | ICD-10-CM | POA: Diagnosis not present

## 2014-08-13 DIAGNOSIS — E860 Dehydration: Secondary | ICD-10-CM | POA: Insufficient documentation

## 2014-08-13 DIAGNOSIS — N390 Urinary tract infection, site not specified: Secondary | ICD-10-CM | POA: Diagnosis not present

## 2014-08-13 DIAGNOSIS — J449 Chronic obstructive pulmonary disease, unspecified: Secondary | ICD-10-CM | POA: Diagnosis not present

## 2014-08-13 DIAGNOSIS — E785 Hyperlipidemia, unspecified: Secondary | ICD-10-CM | POA: Diagnosis not present

## 2014-08-13 DIAGNOSIS — I10 Essential (primary) hypertension: Secondary | ICD-10-CM | POA: Diagnosis not present

## 2014-08-13 DIAGNOSIS — I739 Peripheral vascular disease, unspecified: Secondary | ICD-10-CM | POA: Diagnosis not present

## 2014-08-13 DIAGNOSIS — R531 Weakness: Secondary | ICD-10-CM | POA: Diagnosis present

## 2014-08-13 DIAGNOSIS — Z9889 Other specified postprocedural states: Secondary | ICD-10-CM | POA: Diagnosis not present

## 2014-08-13 DIAGNOSIS — Z9861 Coronary angioplasty status: Secondary | ICD-10-CM | POA: Diagnosis not present

## 2014-08-13 LAB — COMPREHENSIVE METABOLIC PANEL
ALK PHOS: 69 U/L (ref 39–117)
ALT: 13 U/L (ref 0–53)
AST: 19 U/L (ref 0–37)
Albumin: 3.1 g/dL — ABNORMAL LOW (ref 3.5–5.2)
Anion gap: 8 (ref 5–15)
BUN: 20 mg/dL (ref 6–23)
CALCIUM: 8.3 mg/dL — AB (ref 8.4–10.5)
CHLORIDE: 112 mmol/L (ref 96–112)
CO2: 22 mmol/L (ref 19–32)
Creatinine, Ser: 1.66 mg/dL — ABNORMAL HIGH (ref 0.50–1.35)
GFR calc Af Amer: 43 mL/min — ABNORMAL LOW (ref >90)
GFR calc non Af Amer: 37 mL/min — ABNORMAL LOW (ref >90)
GLUCOSE: 108 mg/dL — AB (ref 70–99)
Potassium: 4.1 mmol/L (ref 3.5–5.1)
Sodium: 142 mmol/L (ref 135–145)
Total Bilirubin: 0.7 mg/dL (ref 0.3–1.2)
Total Protein: 5.7 g/dL — ABNORMAL LOW (ref 6.0–8.3)

## 2014-08-13 LAB — URINALYSIS, ROUTINE W REFLEX MICROSCOPIC
BILIRUBIN URINE: NEGATIVE
Glucose, UA: NEGATIVE mg/dL
HGB URINE DIPSTICK: NEGATIVE
Ketones, ur: NEGATIVE mg/dL
NITRITE: POSITIVE — AB
PH: 5.5 (ref 5.0–8.0)
Protein, ur: NEGATIVE mg/dL
Specific Gravity, Urine: 1.016 (ref 1.005–1.030)
Urobilinogen, UA: 0.2 mg/dL (ref 0.0–1.0)

## 2014-08-13 LAB — CBC WITH DIFFERENTIAL/PLATELET
BASOS PCT: 0 % (ref 0–1)
Basophils Absolute: 0 10*3/uL (ref 0.0–0.1)
EOS ABS: 0 10*3/uL (ref 0.0–0.7)
Eosinophils Relative: 0 % (ref 0–5)
HEMATOCRIT: 33.7 % — AB (ref 39.0–52.0)
Hemoglobin: 11.2 g/dL — ABNORMAL LOW (ref 13.0–17.0)
Lymphocytes Relative: 7 % — ABNORMAL LOW (ref 12–46)
Lymphs Abs: 0.7 10*3/uL (ref 0.7–4.0)
MCH: 33.8 pg (ref 26.0–34.0)
MCHC: 33.2 g/dL (ref 30.0–36.0)
MCV: 101.8 fL — ABNORMAL HIGH (ref 78.0–100.0)
MONO ABS: 0.9 10*3/uL (ref 0.1–1.0)
Monocytes Relative: 10 % (ref 3–12)
NEUTROS PCT: 83 % — AB (ref 43–77)
Neutro Abs: 7.4 10*3/uL (ref 1.7–7.7)
PLATELETS: 166 10*3/uL (ref 150–400)
RBC: 3.31 MIL/uL — ABNORMAL LOW (ref 4.22–5.81)
RDW: 14 % (ref 11.5–15.5)
WBC: 9 10*3/uL (ref 4.0–10.5)

## 2014-08-13 LAB — URINE MICROSCOPIC-ADD ON

## 2014-08-13 LAB — I-STAT TROPONIN, ED: TROPONIN I, POC: 0 ng/mL (ref 0.00–0.08)

## 2014-08-13 MED ORDER — SODIUM CHLORIDE 0.9 % IV BOLUS (SEPSIS)
1000.0000 mL | Freq: Once | INTRAVENOUS | Status: AC
  Administered 2014-08-13: 1000 mL via INTRAVENOUS

## 2014-08-13 MED ORDER — CEPHALEXIN 500 MG PO CAPS: 500.0000 mg | ORAL_CAPSULE | Freq: Three times a day (TID) | ORAL | Status: DC

## 2014-08-13 MED ORDER — DEXTROSE 5 % IV SOLN
1.0000 g | Freq: Once | INTRAVENOUS | Status: AC
  Administered 2014-08-13: 1 g via INTRAVENOUS
  Filled 2014-08-13: qty 10

## 2014-08-13 NOTE — ED Notes (Signed)
Bed: WA17 Expected date:  Expected time:  Means of arrival:  Comments: EMS-weak

## 2014-08-13 NOTE — Discharge Instructions (Signed)
Stay hydrated.   Recheck kidney function in a week.   Take keflex three times daily for a week.   Follow up with your doctor.   Avoid strenuous exercise.   Return to ER if you have worse weakness, unable to walk.

## 2014-08-13 NOTE — ED Provider Notes (Signed)
CSN: [id number]     Arrival date & time 08/13/14  1637 History   First MD Initiated Contact with Patient 08/13/14 1717     Chief Complaint  Patient presents with  . Weakness     (Consider location/radiation/quality/duration/timing/severity/associated sxs/prior Treatment) The history is provided by the patient.  Alexander Randolph is a 81 y.o. male hx of HTN, dementia here presenting with weakness. He was walking in the park for about 4 hours today and didn't drink any water. After he got back, he was more weak than usual and required 2 people to assist him. EMS reported that he was hypotensive 88/52. Denies falling or chest pain or abdominal pain.   Level V caveat- dementia    Past Medical History  Diagnosis Date  . Hypertension   . Hyperlipidemia   . Anxiety and depression   . Peripheral neuropathy   . CAD S/P percutaneous coronary angioplasty     DES to RCA in 2007; subtotal occlusion of circumflex treated medically.  Marland Kitchen COPD (chronic obstructive pulmonary disease)   . Dementia     severe organic brain syndrome; potentially exacerbated by alcoholism.  Marland Kitchen History of ETOH abuse   . AAA (abdominal aortic aneurysm) 06/2009    ELG by Dr Hart Rochester - gore aortobifem graft  with common iliac extender and prox extension  cuff of th EVAR.  Marland Kitchen History of fracture of fibula 2010    TREATED BY Dr Ranell Patrick  . Orthostatic hypotension     Suspected of having Shy-Drager synrome in the past.  . PVD (peripheral vascular disease)    Past Surgical History  Procedure Laterality Date  . Nm myoview ltd  07/20/2009    low risk scan; EF 63%  . Cardiac catheterization  03/22/2006    95% DISTAL PORTION OF RCA; MODERATE INFRARENAL ANEURYSM,EF 50% WUTH INFERIOR HYPOKINESIS  . Percutaneous coronary stent intervention (pci-s)  03/30/2006    RCA -mid PCI- Cypher DES stent 2.5 mm x 18 mm, RCA-proximal -PCI - CYPHER DES STENT 2.5 mm x 23 mm  . Transthoracic echocardiogram  07/28/2012    LV EF 50-55%  . Abd aortc  duplex   06/03/2012    AORTA AND ENDOGRAFT PATENT  . Abdominal  aorta doppler  06/16/2009    INFRARENAL AORTA  DILATATION TRANVERSE 5.5 x 5.3 x 5.0;patient refused CT  patient saw Dr Alanda Amass 07/01/2009  . Abdominal aortic aneurysm repair  Aug 25, 2009    Endograft placed - Dr. Quita Skye. Lawson,--Gore aortobifem graft with common iliac extender and proximal extension cuff of the EVAR   Family History  Problem Relation Age of Onset  . Cancer Mother   . Heart disease Father     Heart Disease before age 63  . Heart attack Father   . COPD Father    History  Substance Use Topics  . Smoking status: Former Games developer  . Smokeless tobacco: Never Used  . Alcohol Use: No    Review of Systems  Neurological: Positive for weakness.  All other systems reviewed and are negative.     Allergies  Review of patient's allergies indicates no known allergies.  Home Medications   Prior to Admission medications   Medication Sig Start Date End Date Taking? Authorizing Provider  aspirin 81 MG tablet Take 81 mg by mouth daily.   Yes Historical Provider, MD  clopidogrel (PLAVIX) 75 MG tablet Take 1 tablet (75 mg total) by mouth once. 08/07/13  Yes Runell Gess, MD  donepezil (ARICEPT)  10 MG tablet Take 10 mg by mouth as needed. 06/17/14  Yes Historical Provider, MD  Memantine HCl ER (NAMENDA XR) 28 MG CP24 Take by mouth.   Yes Historical Provider, MD  midodrine (PROAMATINE) 2.5 MG tablet Take 2.5 mg by mouth daily.    Yes Historical Provider, MD  pantoprazole (PROTONIX) 40 MG tablet Take 40 mg by mouth daily.   Yes Historical Provider, MD  simvastatin (ZOCOR) 40 MG tablet Take 40 mg by mouth daily.   Yes Historical Provider, MD   BP 124/68 mmHg  Pulse 80  Temp(Src) 98 F (36.7 C) (Oral)  Resp 18  SpO2 98% Physical Exam  Constitutional:  Chronically ill, slightly dehydrated   HENT:  Head: Normocephalic and atraumatic.  MM slightly dry   Eyes: Conjunctivae are normal. Pupils are equal, round,  and reactive to light.  Neck: Normal range of motion. Neck supple.  Cardiovascular: Normal rate, regular rhythm and normal heart sounds.   Pulmonary/Chest: Effort normal and breath sounds normal. No respiratory distress. He has no wheezes.  Abdominal: Soft. Bowel sounds are normal. He exhibits no distension. There is no tenderness. There is no rebound.  Musculoskeletal: Normal range of motion.  Neurological: He is alert.  Demented, moving all extremities   Skin: Skin is warm and dry.  Psychiatric:  Unable   Nursing note and vitals reviewed.   ED Course  Procedures (including critical care time) Labs Review Labs Reviewed  CBC WITH DIFFERENTIAL/PLATELET - Abnormal; Notable for the following:    RBC 3.31 (*)    Hemoglobin 11.2 (*)    HCT 33.7 (*)    MCV 101.8 (*)    Neutrophils Relative % 83 (*)    Lymphocytes Relative 7 (*)    All other components within normal limits  COMPREHENSIVE METABOLIC PANEL - Abnormal; Notable for the following:    Glucose, Bld 108 (*)    Creatinine, Ser 1.66 (*)    Calcium 8.3 (*)    Total Protein 5.7 (*)    Albumin 3.1 (*)    GFR calc non Af Amer 37 (*)    GFR calc Af Amer 43 (*)    All other components within normal limits  URINALYSIS, ROUTINE W REFLEX MICROSCOPIC - Abnormal; Notable for the following:    APPearance CLOUDY (*)    Nitrite POSITIVE (*)    Leukocytes, UA SMALL (*)    All other components within normal limits  URINE MICROSCOPIC-ADD ON - Abnormal; Notable for the following:    Squamous Epithelial / LPF FEW (*)    Bacteria, UA FEW (*)    Casts HYALINE CASTS (*)    All other components within normal limits  I-STAT TROPOININ, ED    Imaging Review No results found.   EKG Interpretation   Date/Time:  Friday August 13 2014 16:44:11 EDT Ventricular Rate:  78 PR Interval:  206 QRS Duration: 138 QT Interval:  415 QTC Calculation: 473 R Axis:   83 Text Interpretation:  Sinus rhythm Borderline prolonged PR interval Right  bundle  branch block No significant change since last tracing Confirmed by  Latayvia Mandujano  MD, Sanayah Munro ([id number]) on 08/13/2014 5:45:59 PM      MDM   Final diagnoses:  None    Alexander Randolph is a 81 y.o. male here with weakness, hypotension. Hypotension resolved. Likely dehydrated. Will get labs, hydrate and reassess.   8:43 PM Cr 1.6, baseline 0.9. Likely dehydrated. UA + UTI. Given ceftriaxone. Will dc home with keflex. Able to  stand with out assistance. Will dc home.    Richardean Canalavid H Noya Santarelli, MD 08/13/14 2108

## 2014-08-13 NOTE — ED Notes (Signed)
Pt says he does not need to urinate at this time.

## 2014-08-13 NOTE — ED Notes (Addendum)
Per EMS- was walking at the park for 4 hours today. CNA with patient reports he had trouble getting into and out of the car with increased weakness. Has had decreased urination today. Stroke exam negative. Mentation at baseline. Hx dementia, combativeness. Lowest BP was 88/52. 500 cc NS bolus running through 18 G PIV in LAC at this time. Moving all extremities. Has RBBB-unknown if this rhythm is new or old.

## 2014-08-13 NOTE — Progress Notes (Signed)
EDCM spoke to patient and his wife Alexander Randolph at bedside.  Patient lives at home with his wife.  Patient has dimentia per patient's wife.  Patient's wife reports patient has an aide from [employer] who comes 11am to 4pm six days a week.  Patient has a cane at home but doesn't use it per patient's wife.  She reports he is pretty steady on his feet.  Patient also has a shower chair at home.  Patient's wife confirms patient's pcp is Dr. Wylene Simmerisovec.  EDCM asked patient's wife if they would like patient to go into a facility for rehab?  Patient's wife responded, "We tried that at [facility] for two weeks, it didn't work."  [facility] needed [facility] services?  Patient's wife reports she believes he does not need that yet.  EDCM [place] patient's [place].  EDCM informed patient's wife if they decided patient needed home health services, the services may be arranged through his pcp.  Patient and patient's wife thankful for resources.  No further EDCM needs at this time.

## 2014-08-24 ENCOUNTER — Encounter: Payer: Self-pay | Admitting: Family

## 2014-08-25 ENCOUNTER — Ambulatory Visit: Payer: Self-pay | Admitting: Family

## 2014-08-25 ENCOUNTER — Other Ambulatory Visit (HOSPITAL_COMMUNITY): Payer: Self-pay

## 2014-09-02 ENCOUNTER — Other Ambulatory Visit: Payer: Self-pay | Admitting: Cardiovascular Disease

## 2014-09-02 NOTE — Telephone Encounter (Signed)
Rx(s) sent to pharmacy electronically.  

## 2014-09-08 ENCOUNTER — Encounter: Payer: Self-pay | Admitting: Family

## 2014-09-09 ENCOUNTER — Ambulatory Visit: Payer: Self-pay | Admitting: Family

## 2014-09-09 ENCOUNTER — Other Ambulatory Visit (HOSPITAL_COMMUNITY): Payer: Self-pay

## 2014-09-18 ENCOUNTER — Inpatient Hospital Stay (HOSPITAL_COMMUNITY)
Admission: EM | Admit: 2014-09-18 | Discharge: 2014-09-23 | DRG: 690 | Disposition: A | Discharge status: Skilled Nursing Facility | Payer: Medicare Other | Attending: Internal Medicine | Admitting: Internal Medicine

## 2014-09-18 ENCOUNTER — Emergency Department (HOSPITAL_COMMUNITY): Payer: Medicare Other

## 2014-09-18 ENCOUNTER — Encounter (HOSPITAL_COMMUNITY): Payer: Self-pay | Admitting: Emergency Medicine

## 2014-09-18 DIAGNOSIS — E860 Dehydration: Secondary | ICD-10-CM | POA: Diagnosis present

## 2014-09-18 DIAGNOSIS — N390 Urinary tract infection, site not specified: Secondary | ICD-10-CM | POA: Diagnosis not present

## 2014-09-18 DIAGNOSIS — I2510 Atherosclerotic heart disease of native coronary artery without angina pectoris: Secondary | ICD-10-CM

## 2014-09-18 DIAGNOSIS — I714 Abdominal aortic aneurysm, without rupture, unspecified: Secondary | ICD-10-CM | POA: Diagnosis present

## 2014-09-18 DIAGNOSIS — Z7982 Long term (current) use of aspirin: Secondary | ICD-10-CM

## 2014-09-18 DIAGNOSIS — R296 Repeated falls: Secondary | ICD-10-CM

## 2014-09-18 DIAGNOSIS — J449 Chronic obstructive pulmonary disease, unspecified: Secondary | ICD-10-CM | POA: Diagnosis present

## 2014-09-18 DIAGNOSIS — F329 Major depressive disorder, single episode, unspecified: Secondary | ICD-10-CM | POA: Diagnosis present

## 2014-09-18 DIAGNOSIS — I739 Peripheral vascular disease, unspecified: Secondary | ICD-10-CM | POA: Diagnosis present

## 2014-09-18 DIAGNOSIS — Z809 Family history of malignant neoplasm, unspecified: Secondary | ICD-10-CM

## 2014-09-18 DIAGNOSIS — N179 Acute kidney failure, unspecified: Secondary | ICD-10-CM | POA: Diagnosis present

## 2014-09-18 DIAGNOSIS — F1020 Alcohol dependence, uncomplicated: Secondary | ICD-10-CM | POA: Diagnosis present

## 2014-09-18 DIAGNOSIS — R531 Weakness: Secondary | ICD-10-CM | POA: Diagnosis not present

## 2014-09-18 DIAGNOSIS — Z825 Family history of asthma and other chronic lower respiratory diseases: Secondary | ICD-10-CM

## 2014-09-18 DIAGNOSIS — I951 Orthostatic hypotension: Secondary | ICD-10-CM | POA: Diagnosis present

## 2014-09-18 DIAGNOSIS — Z79899 Other long term (current) drug therapy: Secondary | ICD-10-CM

## 2014-09-18 DIAGNOSIS — W19XXXA Unspecified fall, initial encounter: Secondary | ICD-10-CM | POA: Diagnosis present

## 2014-09-18 DIAGNOSIS — F419 Anxiety disorder, unspecified: Secondary | ICD-10-CM | POA: Diagnosis present

## 2014-09-18 DIAGNOSIS — Z7902 Long term (current) use of antithrombotics/antiplatelets: Secondary | ICD-10-CM

## 2014-09-18 DIAGNOSIS — F0390 Unspecified dementia without behavioral disturbance: Secondary | ICD-10-CM | POA: Diagnosis present

## 2014-09-18 DIAGNOSIS — F0280 Dementia in other diseases classified elsewhere without behavioral disturbance: Secondary | ICD-10-CM | POA: Diagnosis present

## 2014-09-18 DIAGNOSIS — E785 Hyperlipidemia, unspecified: Secondary | ICD-10-CM | POA: Diagnosis present

## 2014-09-18 DIAGNOSIS — G629 Polyneuropathy, unspecified: Secondary | ICD-10-CM | POA: Diagnosis present

## 2014-09-18 DIAGNOSIS — F09 Unspecified mental disorder due to known physiological condition: Secondary | ICD-10-CM | POA: Diagnosis present

## 2014-09-18 DIAGNOSIS — R52 Pain, unspecified: Secondary | ICD-10-CM

## 2014-09-18 DIAGNOSIS — Z955 Presence of coronary angioplasty implant and graft: Secondary | ICD-10-CM

## 2014-09-18 DIAGNOSIS — M25511 Pain in right shoulder: Secondary | ICD-10-CM | POA: Diagnosis present

## 2014-09-18 DIAGNOSIS — Z8249 Family history of ischemic heart disease and other diseases of the circulatory system: Secondary | ICD-10-CM

## 2014-09-18 DIAGNOSIS — Z87891 Personal history of nicotine dependence: Secondary | ICD-10-CM

## 2014-09-18 DIAGNOSIS — N183 Chronic kidney disease, stage 3 (moderate): Secondary | ICD-10-CM | POA: Diagnosis present

## 2014-09-18 DIAGNOSIS — I129 Hypertensive chronic kidney disease with stage 1 through stage 4 chronic kidney disease, or unspecified chronic kidney disease: Secondary | ICD-10-CM | POA: Diagnosis present

## 2014-09-18 DIAGNOSIS — Z9861 Coronary angioplasty status: Secondary | ICD-10-CM

## 2014-09-18 DIAGNOSIS — E872 Acidosis: Secondary | ICD-10-CM | POA: Diagnosis present

## 2014-09-18 LAB — CBG MONITORING, ED: Glucose-Capillary: 148 mg/dL — ABNORMAL HIGH (ref 65–99)

## 2014-09-18 NOTE — ED Provider Notes (Signed)
CSN: [id number]     Arrival date & time 09/18/14  2245 History   First MD Initiated Contact with Patient 09/18/14 2257     Chief Complaint  Patient presents with  . Fall     (Consider location/radiation/quality/duration/timing/severity/associated sxs/prior Treatment) HPI  Alexander Randolph is a 81 y.o. male with past medical history of dementia, hypertension, hyperlipidemia presenting today for evaluation for fall. I could not obtain any history from the patient. Her only history is from nursing notes which states he fell twice today. His power of attorney wants to bring him in for evaluation. Patient is denying all complaints to me. Patient does not know why he is here.   Past Medical History  Diagnosis Date  . Hypertension   . Hyperlipidemia   . Anxiety and depression   . Peripheral neuropathy   . CAD S/P percutaneous coronary angioplasty     DES to RCA in 2007; subtotal occlusion of circumflex treated medically.  Marland Kitchen COPD (chronic obstructive pulmonary disease)   . Dementia     severe organic brain syndrome; potentially exacerbated by alcoholism.  Marland Kitchen History of ETOH abuse   . AAA (abdominal aortic aneurysm) 06/2009    ELG by Dr Hart Rochester - gore aortobifem graft  with common iliac extender and prox extension  cuff of th EVAR.  Marland Kitchen History of fracture of fibula 2010    TREATED BY Dr Ranell Patrick  . Orthostatic hypotension     Suspected of having Shy-Drager synrome in the past.  . PVD (peripheral vascular disease)    Past Surgical History  Procedure Laterality Date  . Nm myoview ltd  07/20/2009    low risk scan; EF 63%  . Cardiac catheterization  03/22/2006    95% DISTAL PORTION OF RCA; MODERATE INFRARENAL ANEURYSM,EF 50% WUTH INFERIOR HYPOKINESIS  . Percutaneous coronary stent intervention (pci-s)  03/30/2006    RCA -mid PCI- Cypher DES stent 2.5 mm x 18 mm, RCA-proximal -PCI - CYPHER DES STENT 2.5 mm x 23 mm  . Transthoracic echocardiogram  07/28/2012    LV EF 50-55%  . Abd aortc duplex    06/03/2012    AORTA AND ENDOGRAFT PATENT  . Abdominal  aorta doppler  06/16/2009    INFRARENAL AORTA  DILATATION TRANVERSE 5.5 x 5.3 x 5.0;patient refused CT  patient saw Dr Alanda Amass 07/01/2009  . Abdominal aortic aneurysm repair  Aug 25, 2009    Endograft placed - Dr. Quita Skye. Lawson,--Gore aortobifem graft with common iliac extender and proximal extension cuff of the EVAR   Family History  Problem Relation Age of Onset  . Cancer Mother   . Heart disease Father     Heart Disease before age 31  . Heart attack Father   . COPD Father    History  Substance Use Topics  . Smoking status: Former Games developer  . Smokeless tobacco: Never Used  . Alcohol Use: No    Review of Systems  Unable to perform ROS: Dementia      Allergies  Review of patient's allergies indicates no known allergies.  Home Medications   Prior to Admission medications   Medication Sig Start Date End Date Taking? Authorizing Provider  aspirin 81 MG tablet Take 81 mg by mouth daily.    Historical Provider, MD  cephALEXin (KEFLEX) 500 MG capsule Take 1 capsule (500 mg total) by mouth 3 (three) times daily. 08/13/14   Richardean Canal, MD  clopidogrel (PLAVIX) 75 MG tablet Pt must make and appointment for additional  refills 09/02/14   Runell Gess, MD  donepezil (ARICEPT) 10 MG tablet Take 10 mg by mouth as needed. 06/17/14   Historical Provider, MD  Memantine HCl ER (NAMENDA XR) 28 MG CP24 Take by mouth.    Historical Provider, MD  midodrine (PROAMATINE) 2.5 MG tablet Take 2.5 mg by mouth daily.     Historical Provider, MD  pantoprazole (PROTONIX) 40 MG tablet Take 40 mg by mouth daily.    Historical Provider, MD  simvastatin (ZOCOR) 40 MG tablet Take 40 mg by mouth daily.    Historical Provider, MD   BP 116/62 mmHg  Pulse 89  Temp(Src) 98.4 F (36.9 C) (Oral)  Resp 18  SpO2 96% Physical Exam  Constitutional: Vital signs are normal. He appears well-developed and well-nourished.  Non-toxic appearance. He does not  appear ill. No distress.  Resting tremor  HENT:  Head: Normocephalic and atraumatic.  Nose: Nose normal.  Mouth/Throat: Oropharynx is clear and moist. No oropharyngeal exudate.  Eyes: Conjunctivae and EOM are normal. Pupils are equal, round, and reactive to light. No scleral icterus.  Neck: Normal range of motion. Neck supple. No tracheal deviation, no edema, no erythema and normal range of motion present. No thyroid mass and no thyromegaly present.  Cardiovascular: Normal rate, regular rhythm, S1 normal, S2 normal, normal heart sounds, intact distal pulses and normal pulses.  Exam reveals no gallop and no friction rub.   No murmur heard. Pulses:      Radial pulses are 2+ on the right side, and 2+ on the left side.       Dorsalis pedis pulses are 2+ on the right side, and 2+ on the left side.  Pulmonary/Chest: Effort normal and breath sounds normal. No respiratory distress. He has no wheezes. He has no rhonchi. He has no rales.  Abdominal: Soft. Normal appearance and bowel sounds are normal. He exhibits no distension, no ascites and no mass. There is no hepatosplenomegaly. There is no tenderness. There is no rebound, no guarding and no CVA tenderness.  Musculoskeletal: Normal range of motion. He exhibits no edema or tenderness.  Lymphadenopathy:    He has no cervical adenopathy.  Neurological: He is alert. He has normal strength. No cranial nerve deficit or sensory deficit. He exhibits normal muscle tone.  Normal strength and sensation in all extremities  Skin: Skin is warm, dry and intact. No petechiae and no rash noted. He is not diaphoretic. No erythema. No pallor.  Nursing note and vitals reviewed.   ED Course  Procedures (including critical care time) Labs Review Labs Reviewed  CBC WITH DIFFERENTIAL/PLATELET - Abnormal; Notable for the following:    WBC 15.5 (*)    RBC 3.69 (*)    Hemoglobin 12.2 (*)    HCT 36.5 (*)    Neutrophils Relative % 87 (*)    Neutro Abs 13.6 (*)     Lymphocytes Relative 3 (*)    Lymphs Abs 0.4 (*)    Monocytes Absolute 1.5 (*)    All other components within normal limits  URINALYSIS, ROUTINE W REFLEX MICROSCOPIC (NOT AT [facility]) - Abnormal; Notable for the following:    Color, Urine BIOCHEMICALS MAY BE AFFECTED BY COLOR (*)    APPearance CLOUDY (*)    Glucose, UA 100 (*)    Hgb urine dipstick LARGE (*)    Bilirubin Urine MODERATE (*)    Protein, ur 100 (*)    Leukocytes, UA SMALL (*)    All other components within normal limits  URINE MICROSCOPIC-ADD ON - Abnormal; Notable for the following:    Bacteria, UA MANY (*)    Casts GRANULAR CAST (*)    All other components within normal limits  I-STAT CHEM 8, ED - Abnormal; Notable for the following:    BUN 30 (*)    Creatinine, Ser 2.50 (*)    Glucose, Bld 162 (*)    Hemoglobin 12.6 (*)    HCT 37.0 (*)    All other components within normal limits  I-STAT CG4 LACTIC ACID, ED - Abnormal; Notable for the following:    Lactic Acid, Venous 2.25 (*)    All other components within normal limits  CBG MONITORING, ED - Abnormal; Notable for the following:    Glucose-Capillary 148 (*)    All other components within normal limits    Imaging Review Dg Chest 2 View  09/19/2014   CLINICAL DATA:  Initial evaluation for acute weakness. History of hypertension, COPD.  EXAM: CHEST  2 VIEW  COMPARISON:  None.  FINDINGS: Mild cardiomegaly is stable from previous exam. Mediastinal silhouette within normal limits. Tortuosity the intrathoracic aorta noted. Scattered vascular calcifications present within the aortic arch. Tracheal air column mildly bowed to the right at the level of the aortic arch, stable.  Lungs are normally inflated. Mild linear opacities at the left lung base most compatible with subsegmental atelectasis. No focal infiltrates identified. No pulmonary edema or pleural effusion. No pneumothorax.  Multilevel degenerative changes present within the visualized spine. Sequelae of prior vertebral  augmentation present within the upper lumbar spine. No acute osseous abnormality.  IMPRESSION: 1. No active cardiopulmonary disease. 2. Mild subsegmental atelectasis at the left lung base. 3. Stable cardiomegaly without pulmonary edema.   Electronically Signed   By: Rise MuBenjamin  McClintock M.D.   On: 09/19/2014 01:04     EKG Interpretation None      MDM   Final diagnoses:  Fall   patient presents emergency department for evaluation of a fall today. I see no signs of external injury. Will obtain CT scan of head for further evaluation. I see no indication for laboratory studies at this time. He otherwise appears well and in no acute distress, he is resting comfortably in the room.  Family presents for further history. They state he has been weak and confused. They deny him hitting his head or LOC. Last time this happened the patient states he had a UTIs. Will evaluate with infectious workup.  White count is 15, lactate 2.2, urinalysis shows infection. Patient is a cane with creatinine 2.5. He was given IV fluids, ceftriaxone. He was admitted to [facility] MedSurg unit for continued management.  Tomasita CrumbleAdeleke Phelan Schadt, MD 09/19/14 [phone]

## 2014-09-18 NOTE — ED Notes (Signed)
Per EMS pt is alert and oriented to norm  Pt has fell twice today  Pt has no complaints  Power of attorney wanted him brought in for evaluation

## 2014-09-18 NOTE — ED Notes (Signed)
Pt 's wife states she doesn't want head ct,  Dr Mora Bellmanni aware and at bedside,  Wife states she witnessed both falls today and he didn't hit his head,  Wife wants him to be admitted because he may fall again if he gets up to go to bathroom

## 2014-09-19 DIAGNOSIS — R296 Repeated falls: Secondary | ICD-10-CM

## 2014-09-19 DIAGNOSIS — Z825 Family history of asthma and other chronic lower respiratory diseases: Secondary | ICD-10-CM | POA: Diagnosis not present

## 2014-09-19 DIAGNOSIS — F0280 Dementia in other diseases classified elsewhere without behavioral disturbance: Secondary | ICD-10-CM | POA: Diagnosis present

## 2014-09-19 DIAGNOSIS — F1020 Alcohol dependence, uncomplicated: Secondary | ICD-10-CM | POA: Diagnosis present

## 2014-09-19 DIAGNOSIS — W19XXXA Unspecified fall, initial encounter: Secondary | ICD-10-CM | POA: Diagnosis present

## 2014-09-19 DIAGNOSIS — F419 Anxiety disorder, unspecified: Secondary | ICD-10-CM | POA: Diagnosis present

## 2014-09-19 DIAGNOSIS — I129 Hypertensive chronic kidney disease with stage 1 through stage 4 chronic kidney disease, or unspecified chronic kidney disease: Secondary | ICD-10-CM | POA: Diagnosis present

## 2014-09-19 DIAGNOSIS — F0390 Unspecified dementia without behavioral disturbance: Secondary | ICD-10-CM | POA: Diagnosis present

## 2014-09-19 DIAGNOSIS — Z7982 Long term (current) use of aspirin: Secondary | ICD-10-CM | POA: Diagnosis not present

## 2014-09-19 DIAGNOSIS — R531 Weakness: Secondary | ICD-10-CM

## 2014-09-19 DIAGNOSIS — Z79899 Other long term (current) drug therapy: Secondary | ICD-10-CM | POA: Diagnosis not present

## 2014-09-19 DIAGNOSIS — G629 Polyneuropathy, unspecified: Secondary | ICD-10-CM | POA: Diagnosis present

## 2014-09-19 DIAGNOSIS — I714 Abdominal aortic aneurysm, without rupture: Secondary | ICD-10-CM | POA: Diagnosis present

## 2014-09-19 DIAGNOSIS — F329 Major depressive disorder, single episode, unspecified: Secondary | ICD-10-CM | POA: Diagnosis present

## 2014-09-19 DIAGNOSIS — I739 Peripheral vascular disease, unspecified: Secondary | ICD-10-CM | POA: Diagnosis present

## 2014-09-19 DIAGNOSIS — Z7902 Long term (current) use of antithrombotics/antiplatelets: Secondary | ICD-10-CM | POA: Diagnosis not present

## 2014-09-19 DIAGNOSIS — E785 Hyperlipidemia, unspecified: Secondary | ICD-10-CM | POA: Diagnosis present

## 2014-09-19 DIAGNOSIS — Z955 Presence of coronary angioplasty implant and graft: Secondary | ICD-10-CM | POA: Diagnosis not present

## 2014-09-19 DIAGNOSIS — N179 Acute kidney failure, unspecified: Secondary | ICD-10-CM | POA: Diagnosis not present

## 2014-09-19 DIAGNOSIS — M25511 Pain in right shoulder: Secondary | ICD-10-CM | POA: Diagnosis present

## 2014-09-19 DIAGNOSIS — E860 Dehydration: Secondary | ICD-10-CM | POA: Diagnosis present

## 2014-09-19 DIAGNOSIS — N183 Chronic kidney disease, stage 3 (moderate): Secondary | ICD-10-CM | POA: Diagnosis present

## 2014-09-19 DIAGNOSIS — N390 Urinary tract infection, site not specified: Secondary | ICD-10-CM | POA: Diagnosis present

## 2014-09-19 DIAGNOSIS — I951 Orthostatic hypotension: Secondary | ICD-10-CM | POA: Diagnosis present

## 2014-09-19 DIAGNOSIS — J449 Chronic obstructive pulmonary disease, unspecified: Secondary | ICD-10-CM | POA: Diagnosis present

## 2014-09-19 DIAGNOSIS — Z809 Family history of malignant neoplasm, unspecified: Secondary | ICD-10-CM | POA: Diagnosis not present

## 2014-09-19 DIAGNOSIS — Z8249 Family history of ischemic heart disease and other diseases of the circulatory system: Secondary | ICD-10-CM | POA: Diagnosis not present

## 2014-09-19 DIAGNOSIS — F09 Unspecified mental disorder due to known physiological condition: Secondary | ICD-10-CM | POA: Diagnosis present

## 2014-09-19 DIAGNOSIS — Z87891 Personal history of nicotine dependence: Secondary | ICD-10-CM | POA: Diagnosis not present

## 2014-09-19 DIAGNOSIS — E872 Acidosis: Secondary | ICD-10-CM | POA: Diagnosis present

## 2014-09-19 DIAGNOSIS — I2510 Atherosclerotic heart disease of native coronary artery without angina pectoris: Secondary | ICD-10-CM | POA: Diagnosis present

## 2014-09-19 DIAGNOSIS — N189 Chronic kidney disease, unspecified: Secondary | ICD-10-CM | POA: Diagnosis not present

## 2014-09-19 LAB — LACTIC ACID, PLASMA
Lactic Acid, Venous: 1.9 mmol/L (ref 0.5–2.0)
Lactic Acid, Venous: 1.5 mmol/L (ref 0.5–2.0)

## 2014-09-19 LAB — CBC WITH DIFFERENTIAL/PLATELET
BASOS ABS: 0 10*3/uL (ref 0.0–0.1)
BASOS PCT: 0 % (ref 0–1)
EOS PCT: 0 % (ref 0–5)
Eosinophils Absolute: 0 10*3/uL (ref 0.0–0.7)
HCT: 36.5 % — ABNORMAL LOW (ref 39.0–52.0)
Hemoglobin: 12.2 g/dL — ABNORMAL LOW (ref 13.0–17.0)
LYMPHS ABS: 0.4 10*3/uL — AB (ref 0.7–4.0)
LYMPHS PCT: 3 % — AB (ref 12–46)
MCH: 33.1 pg (ref 26.0–34.0)
MCHC: 33.4 g/dL (ref 30.0–36.0)
MCV: 98.9 fL (ref 78.0–100.0)
MONOS PCT: 10 % (ref 3–12)
Monocytes Absolute: 1.5 10*3/uL — ABNORMAL HIGH (ref 0.1–1.0)
NEUTROS ABS: 13.6 10*3/uL — AB (ref 1.7–7.7)
NEUTROS PCT: 87 % — AB (ref 43–77)
Platelets: 153 10*3/uL (ref 150–400)
RBC: 3.69 MIL/uL — ABNORMAL LOW (ref 4.22–5.81)
RDW: 14 % (ref 11.5–15.5)
WBC: 15.5 10*3/uL — ABNORMAL HIGH (ref 4.0–10.5)

## 2014-09-19 LAB — I-STAT CHEM 8, ED
BUN: 30 mg/dL — AB (ref 6–20)
Calcium, Ion: 1.17 mmol/L (ref 1.13–1.30)
Chloride: 106 mmol/L (ref 101–111)
Creatinine, Ser: 2.5 mg/dL — ABNORMAL HIGH (ref 0.61–1.24)
Glucose, Bld: 162 mg/dL — ABNORMAL HIGH (ref 65–99)
HEMATOCRIT: 37 % — AB (ref 39.0–52.0)
Hemoglobin: 12.6 g/dL — ABNORMAL LOW (ref 13.0–17.0)
POTASSIUM: 4.2 mmol/L (ref 3.5–5.1)
Sodium: 138 mmol/L (ref 135–145)
TCO2: 21 mmol/L (ref 0–100)

## 2014-09-19 LAB — URINALYSIS, ROUTINE W REFLEX MICROSCOPIC
Glucose, UA: 100 mg/dL — AB
Ketones, ur: NEGATIVE mg/dL
NITRITE: NEGATIVE
Protein, ur: 100 mg/dL — AB
Specific Gravity, Urine: 1.022 (ref 1.005–1.030)
Urobilinogen, UA: 1 mg/dL (ref 0.0–1.0)
pH: 5.5 (ref 5.0–8.0)

## 2014-09-19 LAB — I-STAT CG4 LACTIC ACID, ED: Lactic Acid, Venous: 2.25 mmol/L (ref 0.5–2.0)

## 2014-09-19 LAB — CBC
HCT: 35.6 % — ABNORMAL LOW (ref 39.0–52.0)
Hemoglobin: 11.8 g/dL — ABNORMAL LOW (ref 13.0–17.0)
MCH: 33.7 pg (ref 26.0–34.0)
MCHC: 33.1 g/dL (ref 30.0–36.0)
MCV: 101.7 fL — ABNORMAL HIGH (ref 78.0–100.0)
Platelets: 150 10*3/uL (ref 150–400)
RBC: 3.5 MIL/uL — ABNORMAL LOW (ref 4.22–5.81)
RDW: 14.2 % (ref 11.5–15.5)
WBC: 12.2 10*3/uL — ABNORMAL HIGH (ref 4.0–10.5)

## 2014-09-19 LAB — BASIC METABOLIC PANEL
ANION GAP: 7 (ref 5–15)
BUN: 30 mg/dL — ABNORMAL HIGH (ref 6–20)
CO2: 24 mmol/L (ref 22–32)
CREATININE: 2.54 mg/dL — AB (ref 0.61–1.24)
Calcium: 8.6 mg/dL — ABNORMAL LOW (ref 8.9–10.3)
Chloride: 109 mmol/L (ref 101–111)
GFR calc Af Amer: 26 mL/min — ABNORMAL LOW (ref >60)
GFR calc non Af Amer: 22 mL/min — ABNORMAL LOW (ref >60)
Glucose, Bld: 154 mg/dL — ABNORMAL HIGH (ref 65–99)
Potassium: 3.9 mmol/L (ref 3.5–5.1)
Sodium: 140 mmol/L (ref 135–145)

## 2014-09-19 LAB — URINE MICROSCOPIC-ADD ON

## 2014-09-19 MED ORDER — MIDODRINE HCL 2.5 MG PO TABS
2.5000 mg | ORAL_TABLET | Freq: Every day | ORAL | Status: DC
  Administered 2014-09-19 – 2014-09-23 (×5): 2.5 mg via ORAL
  Filled 2014-09-19 (×5): qty 1

## 2014-09-19 MED ORDER — DEXTROSE 5 % IV SOLN
1.0000 g | Freq: Once | INTRAVENOUS | Status: AC
  Administered 2014-09-19: 1 g via INTRAVENOUS
  Filled 2014-09-19: qty 10

## 2014-09-19 MED ORDER — DONEPEZIL HCL 10 MG PO TABS
10.0000 mg | ORAL_TABLET | Freq: Every day | ORAL | Status: DC
  Administered 2014-09-19 – 2014-09-22 (×4): 10 mg via ORAL
  Filled 2014-09-19 (×5): qty 1

## 2014-09-19 MED ORDER — SODIUM CHLORIDE 0.9 % IV SOLN
INTRAVENOUS | Status: DC
  Administered 2014-09-19 – 2014-09-21 (×3): via INTRAVENOUS

## 2014-09-19 MED ORDER — SODIUM CHLORIDE 0.9 % IV BOLUS (SEPSIS)
1000.0000 mL | Freq: Once | INTRAVENOUS | Status: AC
  Administered 2014-09-19: 1000 mL via INTRAVENOUS

## 2014-09-19 MED ORDER — ASPIRIN EC 81 MG PO TBEC
81.0000 mg | DELAYED_RELEASE_TABLET | Freq: Every day | ORAL | Status: DC
  Administered 2014-09-19 – 2014-09-23 (×5): 81 mg via ORAL
  Filled 2014-09-19 (×5): qty 1

## 2014-09-19 MED ORDER — CLOPIDOGREL BISULFATE 75 MG PO TABS
75.0000 mg | ORAL_TABLET | Freq: Every day | ORAL | Status: DC
  Administered 2014-09-19 – 2014-09-23 (×5): 75 mg via ORAL
  Filled 2014-09-19 (×5): qty 1

## 2014-09-19 MED ORDER — CEFTRIAXONE SODIUM IN DEXTROSE 20 MG/ML IV SOLN
1.0000 g | INTRAVENOUS | Status: DC
Start: 2014-09-20 — End: 2014-09-23
  Administered 2014-09-20 – 2014-09-23 (×4): 1 g via INTRAVENOUS
  Filled 2014-09-19 (×4): qty 50

## 2014-09-19 MED ORDER — SIMVASTATIN 40 MG PO TABS
40.0000 mg | ORAL_TABLET | Freq: Every day | ORAL | Status: DC
Start: 2014-09-19 — End: 2014-09-23
  Administered 2014-09-19 – 2014-09-23 (×5): 40 mg via ORAL
  Filled 2014-09-19 (×5): qty 1

## 2014-09-19 MED ORDER — PANTOPRAZOLE SODIUM 40 MG PO TBEC
40.0000 mg | DELAYED_RELEASE_TABLET | Freq: Every day | ORAL | Status: DC
  Administered 2014-09-19 – 2014-09-23 (×5): 40 mg via ORAL
  Filled 2014-09-19 (×5): qty 1

## 2014-09-19 MED ORDER — MEMANTINE HCL ER 28 MG PO CP24
28.0000 mg | ORAL_CAPSULE | Freq: Every day | ORAL | Status: DC
  Administered 2014-09-19 – 2014-09-23 (×5): 28 mg via ORAL
  Filled 2014-09-19 (×5): qty 1

## 2014-09-19 NOTE — Progress Notes (Signed)
[facility]                                                                              Patient Demographics  Alexander Randolph, is a 81 y.o. male, DOB - 01/14/1932, UEA:[id number]  Admit date - 09/18/2014   Admitting Physician Haydee Monicaachal A David, MD  Outpatient Primary MD for the patient is Gaspar GarbeISOVEC,RICHARD W, MD  LOS -    Chief Complaint  Patient presents with  . Fall       Brief HPI   81 year old patient with dementia, hypertension, hyperlipidemia, CAD, COPD, AAA, orthostatic hypotension, brought in by family due to generalized weakness, 2 falls at home. Per patient's wife he was getting weak when he tried to ambulate and not acting normally, more confused than normal. Patient was admitted for further workup.   Assessment & Plan    Principal Problem:   AKI (acute kidney injury) with lactic acidosis, dehydration superimposed on chronic kidney disease, stage III -Continue IV fluid hydration, lactic acid improving, treat UTI   Active Problems:   AAA (abdominal aortic aneurysm) without rupture -Follows Dr. Hart RochesterLawson outpatient  Urinary tract infection - Follow urine culture and sensitivities, continue IV Rocephin   generalized weakness with dementia and falls - PTOT evaluation, currently at home with his wife    CAD S/P percutaneous coronary angioplasty - Continue aspirin, Plavix, statin    Orthostatic hypotension - Continue midodrine  Advanced dementia   continue Namenda, Aricept  Hyperlipidemia: continue Zocor  Code Status: Full code   Family Communication: Discussed in detail with the patient, all imaging results, lab results explained to the patient and wife at the bedside  Disposition Plan: PT  pending   Time Spent in minutes 25 minutes  Procedures  None   Consults   None   DVT Prophylaxis  SCD's  Medications  Scheduled Meds: . aspirin EC  81 mg Oral Daily  . [START ON 09/20/2014] cefTRIAXone (ROCEPHIN)  IV  1 g Intravenous Q24H  .  clopidogrel  75 mg Oral Daily  . donepezil  10 mg Oral QHS  . memantine  28 mg Oral Daily  . midodrine  2.5 mg Oral Daily  . pantoprazole  40 mg Oral Daily  . simvastatin  40 mg Oral Daily   Continuous Infusions: . sodium chloride 75 mL/hr at 09/19/14 0956   PRN Meds:.   Antibiotics   Anti-infectives    Start     Dose/Rate Route Frequency Ordered Stop   09/20/14 0200  cefTRIAXone (ROCEPHIN) 1 g in dextrose 5 % 50 mL IVPB - Premix     1 g 100 mL/hr over 30 Minutes Intravenous Every 24 hours 09/19/14 0858     09/19/14 0215  cefTRIAXone (ROCEPHIN) 1 g in dextrose 5 % 50 mL IVPB     1 g 100 mL/hr over 30 Minutes Intravenous  Once 09/19/14 0201 09/19/14 0310        Subjective:   Alexander Randolph was seen and examined today.  Feeling weak otherwise stable, Patient denies dizziness, chest pain, shortness of breath, abdominal pain, N/V/D/C, new weakness, numbess, tingling. No acute events overnight.  Objective:   Blood pressure 136/74, pulse 91, temperature 97.7 F (36.5 C), temperature source Oral, resp. rate 20, height  (1.88 m), weight 91.2 kg (201 lb 1 oz), SpO2 100 %.  Wt Readings from Last 3 Encounters:  09/19/14 91.2 kg (201 lb 1 oz)  07/27/13 87.635 kg (193 lb 3.2 oz)  07/09/13 87.544 kg (193 lb)    No intake or output data in the 24 hours ending 09/19/14 1118  Exam  General: Alert and oriented x 3, NAD  HEENT:  PERRLA, EOMI, Anicteric Sclera, mucous membranes moist.   Neck: Supple, no JVD, no masses  CVS: S1 S2 auscultated, no rubs, murmurs or gallops. Regular rate and rhythm.  Respiratory: Clear to auscultation bilaterally, no wheezing, rales or rhonchi  Abdomen: Soft, nontender, nondistended, + bowel sounds  Ext: no cyanosis clubbing or edema  Neuro: AAOx3, Cr N's II- XII. Strength 5/5 upper and lower extremities bilaterally  Skin: No rashes  Psych: Normal affect and demeanor, alert and oriented x3    Data Review   Micro Results No  results found for this or any previous visit (from the past 240 hour(s)).  Radiology Reports Dg Chest 2 View  09/19/2014   CLINICAL DATA:  Initial evaluation for acute weakness. History of hypertension, COPD.  EXAM: CHEST  2 VIEW  COMPARISON:  None.  FINDINGS: Mild cardiomegaly is stable from previous exam. Mediastinal silhouette within normal limits. Tortuosity the intrathoracic aorta noted. Scattered vascular calcifications present within the aortic arch. Tracheal air column mildly bowed to the right at the level of the aortic arch, stable.  Lungs are normally inflated. Mild linear opacities at the left lung base most compatible with subsegmental atelectasis. No focal infiltrates identified. No pulmonary edema or pleural effusion. No pneumothorax.  Multilevel degenerative changes present within the visualized spine. Sequelae of prior vertebral augmentation present within the upper lumbar spine. No acute osseous abnormality.  IMPRESSION: 1. No active cardiopulmonary disease. 2. Mild subsegmental atelectasis at the left lung base. 3. Stable cardiomegaly without pulmonary edema.   Electronically Signed   By: Rise Mu M.D.   On: 09/19/2014 01:04    CBC  Recent Labs Lab 09/18/14 2355 09/19/14 0001 09/19/14 0930  WBC 15.5*  --  12.2*  HGB 12.2* 12.6* 11.8*  HCT 36.5* 37.0* 35.6*  PLT 153  --  150  MCV 98.9  --  101.7*  MCH 33.1  --  33.7  MCHC 33.4  --  33.1  RDW 14.0  --  14.2  LYMPHSABS 0.4*  --   --   MONOABS 1.5*  --   --   EOSABS 0.0  --   --   BASOSABS 0.0  --   --     Chemistries   Recent Labs Lab 09/19/14 0001 09/19/14 0930  NA 138 140  K 4.2 3.9  CL 106 109  CO2  --  24  GLUCOSE 162* 154*  BUN 30* 30*  CREATININE 2.50* 2.54*  CALCIUM  --  8.6*   ------------------------------------------------------------------------------------------------------------------ estimated creatinine clearance is 27 mL/min (by C-G formula based on Cr of  2.54). ------------------------------------------------------------------------------------------------------------------ No results for input(s): HGBA1C in the last 72 hours. ------------------------------------------------------------------------------------------------------------------ No results for input(s): CHOL, HDL, LDLCALC, TRIG, CHOLHDL, LDLDIRECT in the last 72 hours. ------------------------------------------------------------------------------------------------------------------ No results for input(s): TSH, T4TOTAL, T3FREE, THYROIDAB in the last 72 hours.  Invalid input(s): FREET3 ------------------------------------------------------------------------------------------------------------------ No results for input(s): VITAMINB12, FOLATE, FERRITIN, TIBC, IRON, RETICCTPCT in the last 72 hours.  Coagulation profile No  results for input(s): INR, PROTIME in the last 168 hours.  No results for input(s): DDIMER in the last 72 hours.  Cardiac Enzymes No results for input(s): CKMB, TROPONINI, MYOGLOBIN in the last 168 hours.  Invalid input(s): CK ------------------------------------------------------------------------------------------------------------------ Invalid input(s): POCBNP   Recent Labs  09/18/14 2351  GLUCAP 148*     Keeanna Villafranca M.D. [facility] 09/19/2014, 11:18 AM  Pager: [phone]   Between 7am to 7pm - call Pager - [phone]  After 7pm go to [email or web address] - password [email or web address]  Call night coverage person covering after 7pm

## 2014-09-19 NOTE — ED Notes (Signed)
Pt is being held in ED for medical bed availability

## 2014-09-19 NOTE — H&P (Signed)
PCP:   Gaspar GarbeISOVEC,RICHARD W, MD   Chief Complaint:  Larey SeatFell twice today  HPI: 81 yo male lives at home with his wife h/o dementia, htn, CAD, COPD, AAA, orthostatic hypotension brought in by family due to generalized weakness, gets weak when he tries to walk and goes to the ground gently, not quite acting normally.  More confused than normal.  No fevers.  No n/v/d.  Wife left but daughter at bedside, she really cannot provide much useful history except that he is weaker than normal.  Pt found to have uti and aki.  Asked to admit for ivf.  Pt denies any pain anywhere and appears comfortable.  Review of Systems:  Unobtainable from pt due to dementia  Past Medical History: Past Medical History  Diagnosis Date  . Hypertension   . Hyperlipidemia   . Anxiety and depression   . Peripheral neuropathy   . CAD S/P percutaneous coronary angioplasty     DES to RCA in 2007; subtotal occlusion of circumflex treated medically.  Marland Kitchen. COPD (chronic obstructive pulmonary disease)   . Dementia     severe organic brain syndrome; potentially exacerbated by alcoholism.  Marland Kitchen. History of ETOH abuse   . AAA (abdominal aortic aneurysm) 06/2009    ELG by Dr Hart RochesterLawson - gore aortobifem graft  with common iliac extender and prox extension  cuff of th EVAR.  Marland Kitchen. History of fracture of fibula 2010    TREATED BY Dr Ranell PatrickNorris  . Orthostatic hypotension     Suspected of having Shy-Drager synrome in the past.  . PVD (peripheral vascular disease)    Past Surgical History  Procedure Laterality Date  . Nm myoview ltd  07/20/2009    low risk scan; EF 63%  . Cardiac catheterization  03/22/2006    95% DISTAL PORTION OF RCA; MODERATE INFRARENAL ANEURYSM,EF 50% WUTH INFERIOR HYPOKINESIS  . Percutaneous coronary stent intervention (pci-s)  03/30/2006    RCA -mid PCI- Cypher DES stent 2.5 mm x 18 mm, RCA-proximal -PCI - CYPHER DES STENT 2.5 mm x 23 mm  . Transthoracic echocardiogram  07/28/2012    LV EF 50-55%  . Abd aortc duplex    06/03/2012    AORTA AND ENDOGRAFT PATENT  . Abdominal  aorta doppler  06/16/2009    INFRARENAL AORTA  DILATATION TRANVERSE 5.5 x 5.3 x 5.0;patient refused CT  patient saw Dr Alanda AmassWeintraub 07/01/2009  . Abdominal aortic aneurysm repair  Aug 25, 2009    Endograft placed - Dr. Quita SkyeJames D. Lawson,--Gore aortobifem graft with common iliac extender and proximal extension cuff of the EVAR    Medications: Prior to Admission medications   Medication Sig Start Date End Date Taking? Authorizing Provider  aspirin 81 MG tablet Take 81 mg by mouth daily.   Yes Historical Provider, MD  clopidogrel (PLAVIX) 75 MG tablet Pt must make and appointment for additional refills 09/02/14  Yes Runell GessJonathan J Berry, MD  donepezil (ARICEPT) 10 MG tablet Take 10 mg by mouth at bedtime.  06/17/14  Yes Historical Provider, MD  Memantine HCl ER (NAMENDA XR) 28 MG CP24 Take 28 mg by mouth daily.    Yes Historical Provider, MD  midodrine (PROAMATINE) 2.5 MG tablet Take 2.5 mg by mouth daily.    Yes Historical Provider, MD  pantoprazole (PROTONIX) 40 MG tablet Take 40 mg by mouth daily.   Yes Historical Provider, MD  simvastatin (ZOCOR) 40 MG tablet Take 40 mg by mouth daily.   Yes Historical Provider, MD  cephALEXin [facility](KEFLEX)  500 MG capsule Take 1 capsule (500 mg total) by mouth 3 (three) times daily. Patient not taking: Reported on 09/19/2014 08/13/14   Richardean Canal, MD    Allergies:  No Known Allergies  Social History:  reports that he has quit smoking. He has never used smokeless tobacco. He reports that he does not drink alcohol or use illicit drugs.  Family History: Family History  Problem Relation Age of Onset  . Cancer Mother   . Heart disease Father     Heart Disease before age 29  . Heart attack Father   . COPD Father     Physical Exam: Filed Vitals:   09/18/14 2249  BP: 116/62  Pulse: 89  Temp: 98.4 F (36.9 C)  TempSrc: Oral  Resp: 18  SpO2: 96%   General appearance: alert, cooperative, appears stated age and  no distress  MM dry Head: Normocephalic, without obvious abnormality, atraumatic Eyes: negative Nose: Nares normal. Septum midline. Mucosa normal. No drainage or sinus tenderness. Neck: no JVD and supple, symmetrical, trachea midline Lungs: clear to auscultation bilaterally Heart: regular rate and rhythm, S1, S2 normal, no murmur, click, rub or gallop Abdomen: soft, non-tender; bowel sounds normal; no masses,  no organomegaly Extremities: extremities normal, atraumatic, no cyanosis or edema Pulses: 2+ and symmetric Skin: Skin color, texture, turgor normal. No rashes or lesions Neurologic: Grossly normal    Labs on Admission:   Recent Labs  09/19/14 0001  NA 138  K 4.2  CL 106  GLUCOSE 162*  BUN 30*  CREATININE 2.50*    Recent Labs  09/18/14 2355 09/19/14 0001  WBC 15.5*  --   NEUTROABS 13.6*  --   HGB 12.2* 12.6*  HCT 36.5* 37.0*  MCV 98.9  --   PLT 153  --    Radiological Exams on Admission: Dg Chest 2 View  09/19/2014   CLINICAL DATA:  Initial evaluation for acute weakness. History of hypertension, COPD.  EXAM: CHEST  2 VIEW  COMPARISON:  None.  FINDINGS: Mild cardiomegaly is stable from previous exam. Mediastinal silhouette within normal limits. Tortuosity the intrathoracic aorta noted. Scattered vascular calcifications present within the aortic arch. Tracheal air column mildly bowed to the right at the level of the aortic arch, stable.  Lungs are normally inflated. Mild linear opacities at the left lung base most compatible with subsegmental atelectasis. No focal infiltrates identified. No pulmonary edema or pleural effusion. No pneumothorax.  Multilevel degenerative changes present within the visualized spine. Sequelae of prior vertebral augmentation present within the upper lumbar spine. No acute osseous abnormality.  IMPRESSION: 1. No active cardiopulmonary disease. 2. Mild subsegmental atelectasis at the left lung base. 3. Stable cardiomegaly without pulmonary edema.    Electronically Signed   By: Rise Mu M.D.   On: 09/19/2014 01:04   Old records reviewed cxr reviewed no edema no infiltrate Case discussed with dr Mora Bellman edp  Assessment/Plan  81 yo male with generalized weakness, uti, mild dehydration with mild aki  Principal Problem:   AKI (acute kidney injury)-  Baseline unclear, last cr here was 1.6 but not very many labs to review.  Has received one liter ivf bolus already, cont at 75cc/hour ns overnight.  Repeat cr in am.  Will likely improve , due to prerenal/infection.   Active Problems:   AAA (abdominal aortic aneurysm) without rupture- noted   CAD S/P percutaneous coronary angioplasty- noted   Orthostatic hypotension-  Check orthostatics.  ivf.   Dementia- noted   Weakness  generalized- noted   Falls frequently today- secondary to infection/dehydration, obtain PT eval   UTI (lower urinary tract infection)-  Iv rocephin, urine cx pending   CKD (chronic kidney disease)-  noted  obs on medical floor.  Ivf.  Full code.  Will likely need HH/PT at home.  Shandiin Eisenbeis A 09/19/2014, 2:20 AM

## 2014-09-19 NOTE — ED Notes (Signed)
Pt is resting quietly no complaints,  His daughter is at bedside

## 2014-09-19 NOTE — ED Notes (Signed)
Attempted to call report. RN unavailable and to call back in 5 minutes.

## 2014-09-20 DIAGNOSIS — Z9861 Coronary angioplasty status: Secondary | ICD-10-CM

## 2014-09-20 DIAGNOSIS — I2510 Atherosclerotic heart disease of native coronary artery without angina pectoris: Secondary | ICD-10-CM

## 2014-09-20 DIAGNOSIS — N390 Urinary tract infection, site not specified: Principal | ICD-10-CM

## 2014-09-20 DIAGNOSIS — I714 Abdominal aortic aneurysm, without rupture: Secondary | ICD-10-CM

## 2014-09-20 DIAGNOSIS — I951 Orthostatic hypotension: Secondary | ICD-10-CM

## 2014-09-20 DIAGNOSIS — N189 Chronic kidney disease, unspecified: Secondary | ICD-10-CM

## 2014-09-20 LAB — BASIC METABOLIC PANEL
ANION GAP: 9 (ref 5–15)
BUN: 24 mg/dL — ABNORMAL HIGH (ref 6–20)
CALCIUM: 8.2 mg/dL — AB (ref 8.9–10.3)
CHLORIDE: 110 mmol/L (ref 101–111)
CO2: 21 mmol/L — AB (ref 22–32)
Creatinine, Ser: 1.58 mg/dL — ABNORMAL HIGH (ref 0.61–1.24)
GFR calc non Af Amer: 40 mL/min — ABNORMAL LOW (ref >60)
GFR, EST AFRICAN AMERICAN: 46 mL/min — AB (ref >60)
Glucose, Bld: 107 mg/dL — ABNORMAL HIGH (ref 65–99)
Potassium: 4 mmol/L (ref 3.5–5.1)
Sodium: 140 mmol/L (ref 135–145)

## 2014-09-20 LAB — CBC
HCT: 32.1 % — ABNORMAL LOW (ref 39.0–52.0)
Hemoglobin: 10.8 g/dL — ABNORMAL LOW (ref 13.0–17.0)
MCH: 33.4 pg (ref 26.0–34.0)
MCHC: 33.6 g/dL (ref 30.0–36.0)
MCV: 99.4 fL (ref 78.0–100.0)
Platelets: 129 10*3/uL — ABNORMAL LOW (ref 150–400)
RBC: 3.23 MIL/uL — ABNORMAL LOW (ref 4.22–5.81)
RDW: 14.1 % (ref 11.5–15.5)
WBC: 9.5 10*3/uL (ref 4.0–10.5)

## 2014-09-20 MED ORDER — LORAZEPAM 2 MG/ML IJ SOLN
0.5000 mg | Freq: Once | INTRAMUSCULAR | Status: AC
  Administered 2014-09-20: 0.5 mg via INTRAVENOUS
  Filled 2014-09-20: qty 1

## 2014-09-20 NOTE — Progress Notes (Signed)
CSW received referral for new SNF.   CSW visited pt bedside. Pt sleeping and awoke as CSW entered room, but then went back to sleep and did not respond to CSW calling pt name. Pt has safety sitter at bedside who reports that pt wife was present at bedside, but had stepped out to return home to tend to family pets.   CSW to follow up with pt and pt wife in order to complete full psychosocial assessment.   CSW to continue to follow.   Loletta SpecterSuzanna Kidd, MSW, LCSW Clinical Social Work Coverage for [place], [place] [phone]

## 2014-09-20 NOTE — Evaluation (Signed)
Physical Therapy Evaluation Patient Details Name: Alexander Randolph MRN: [id number] DOB: 07-08-1931 Today's Date: 09/20/2014   History of Present Illness  81 year old patient with dementia, hypertension, hyperlipidemia, CAD, COPD, AAA, orthostatic hypotension, brought in by family due to generalized weakness, 2 falls at home. Per patient's wife he was getting weak when he tried to ambulate and not acting normally, more confused than normal  Clinical Impression  Pt admitted with above diagnosis. Pt currently with functional limitations due to the deficits listed below (see PT Problem List). Pt will benefit from skilled PT to increase their independence and safety with mobility to allow discharge to the venue listed below.       Follow Up Recommendations SNF    Equipment Recommendations  None recommended by PT    Recommendations for Other Services       Precautions / Restrictions Precautions Precautions: Fall      Mobility  Bed Mobility Overal bed mobility: Needs Assistance Bed Mobility: Supine to Sit;Sit to Supine     Supine to sit: Mod assist Sit to supine: Mod assist   General bed mobility comments: partial supine to sit, assist with trunk, pt repeatedly states "no" to everything; he does place LEs onto bed without assist, incr time and verbal cues   Transfers                 General transfer comment: NT--pt refused and was too lethargic as well  Ambulation/Gait                Stairs            Wheelchair Mobility    Modified Rankin (Stroke Patients Only)       Balance Overall balance assessment: History of Falls                                           Pertinent Vitals/Pain Pain Assessment: No/denies pain    Home Living Family/patient expects to be discharged to:: Skilled nursing facility Living Arrangements: Spouse/significant other               Additional Comments: has caregivers from 11-4    Prior  Function Level of Independence: Independent with assistive device(s);Independent         Comments: amb with cane; caregivers assist with bathing     Hand Dominance        Extremity/Trunk Assessment   Upper Extremity Assessment: Generalized weakness;Difficult to assess due to impaired cognition (adn lethargy)           Lower Extremity Assessment: Generalized weakness;Difficult to assess due to impaired cognition (and lethargy)         Communication   Communication: No difficulties  Cognition Arousal/Alertness: Lethargic;Suspect due to medications Behavior During Therapy: [facility] for tasks assessed/performed Overall Cognitive Status: Difficult to assess                      General Comments      Exercises        Assessment/Plan    PT Assessment Patient needs continued PT services  PT Diagnosis Generalized weakness   PT Problem List Decreased activity tolerance;Decreased balance;Decreased mobility;Decreased strength  PT Treatment Interventions DME instruction;Gait training;Functional mobility training;Therapeutic exercise;Therapeutic activities   PT Goals (Current goals can be found in the Care Plan section) Acute Rehab PT Goals Patient Stated Goal: none stated PT  Goal Formulation: With patient Time For Goal Achievement: 10/04/14 Potential to Achieve Goals: Good    Frequency Min 3X/week   Barriers to discharge        Co-evaluation               End of Session   Activity Tolerance: Patient limited by lethargy Patient left: in bed;with call bell/phone within reach;with family/visitor present;with nursing/sitter in room Nurse Communication: Mobility status         Time: 1215-1228 PT Time Calculation (min) (ACUTE ONLY): 13 min   Charges:   PT Evaluation $Initial PT Evaluation Tier I: 1 Procedure     PT G CodesDrucilla Chalet 22-Sep-2014, 2:18 PM

## 2014-09-20 NOTE — Progress Notes (Signed)
[facility]                                                                              Patient Demographics  Alexander Randolph, is a 81 y.o. male, DOB - 1931/06/27, ZOX:[id number]  Admit date - 09/18/2014   Admitting Physician Haydee Monica, MD  Outpatient Primary MD for the patient is Gaspar Garbe, MD  LOS - 1   Chief Complaint  Patient presents with  . Fall       Brief HPI   81 year old patient with dementia, hypertension, hyperlipidemia, CAD, COPD, AAA, orthostatic hypotension, brought in by family due to generalized weakness, 2 falls at home. Per patient's wife he was getting weak when he tried to ambulate and not acting normally, more confused than normal. Patient was admitted for further workup.   Assessment & Plan    Principal Problem:   AKI (acute kidney injury) with lactic acidosis, dehydration superimposed on chronic kidney disease, stage III -Continue IV fluid hydration, lactic acid improving, treat UTI   Active Problems:   AAA (abdominal aortic aneurysm) without rupture -Follows Dr. Hart [place] outpatient  Urinary tract infection - Unfortunately no urine culture available, continue continue IV Rocephin   generalized weakness with dementia and falls - PTOT evaluation, currently at home with his wife - Start PT evaluation today    CAD S/P percutaneous coronary angioplasty - Continue aspirin, Plavix, statin    Orthostatic hypotension - Continue midodrine  Advanced dementia   continue Namenda, Aricept  Hyperlipidemia: continue Zocor  Code Status: Full code   Family Communication: Discussed in detail with the patient, all imaging results, lab results explained to the patient and wife at the bedside yesterday  Disposition Plan: PT  evaluation pending pending   Time Spent in minutes 25 minutes  Procedures  None   Consults   None   DVT Prophylaxis  SCD's  Medications  Scheduled Meds: . aspirin EC  81 mg Oral Daily  .  cefTRIAXone (ROCEPHIN)  IV  1 g Intravenous Q24H  . clopidogrel  75 mg Oral Daily  . donepezil  10 mg Oral QHS  . memantine  28 mg Oral Daily  . midodrine  2.5 mg Oral Daily  . pantoprazole  40 mg Oral Daily  . simvastatin  40 mg Oral Daily   Continuous Infusions: . sodium chloride 100 mL/hr at 09/20/14 0916   PRN Meds:.   Antibiotics   Anti-infectives    Start     Dose/Rate Route Frequency Ordered Stop   09/20/14 0200  cefTRIAXone (ROCEPHIN) 1 g in dextrose 5 % 50 mL IVPB - Premix     1 g 100 mL/hr over 30 Minutes Intravenous Every 24 hours 09/19/14 0858     09/19/14 0215  cefTRIAXone (ROCEPHIN) 1 g in dextrose 5 % 50 mL IVPB     1 g 100 mL/hr over 30 Minutes Intravenous  Once 09/19/14 0201 09/19/14 0310        Subjective:   [name] was seen and examined today. Sleepy with sitter at the bedside, no acute events overnight. Unable to obtain review of systems from the patient due to  his mental status, dementia and sleepy  Objective:   Blood pressure 126/78, pulse 78, temperature 98.4 F (36.9 C), temperature source Oral, resp. rate 18, height 6\' 2"  (1.88 m), weight 91.2 kg (201 lb 1 oz), SpO2 98 %.  Wt Readings from Last 3 Encounters:  09/19/14 91.2 kg (201 lb 1 oz)  07/27/13 87.635 kg (193 lb 3.2 oz)  07/09/13 87.544 kg (193 lb)     Intake/Output Summary (Last 24 hours) at 09/20/14 1216 Last data filed at 09/20/14 0836  Gross per 24 hour  Intake    720 ml  Output    900 ml  Net   -180 ml    Exam  General: Sleepy, NAD, sitter at the bedside  HEENT:  PERRLA, EOMI  Neck: Supple, no JVD, no masses  CVS: S1 S2 auscultated, no rubs, murmurs or gallops. Regular rate and rhythm.  Respiratory: Clear to auscultation bilaterally, no wheezing, rales or rhonchi  Abdomen: Soft, nontender, nondistended, + bowel sounds  Ext: no cyanosis clubbing or edema  Neuro: unable to his  Skin: No rashes  Psych: Sleepy   Data Review   Micro Results No results  found for this or any previous visit (from the past 240 hour(s)).  Radiology Reports Dg Chest 2 View  09/19/2014   CLINICAL DATA:  Initial evaluation for acute weakness. History of hypertension, COPD.  EXAM: CHEST  2 VIEW  COMPARISON:  None.  FINDINGS: Mild cardiomegaly is stable from previous exam. Mediastinal silhouette within normal limits. Tortuosity the intrathoracic aorta noted. Scattered vascular calcifications present within the aortic arch. Tracheal air column mildly bowed to the right at the level of the aortic arch, stable.  Lungs are normally inflated. Mild linear opacities at the left lung base most compatible with subsegmental atelectasis. No focal infiltrates identified. No pulmonary edema or pleural effusion. No pneumothorax.  Multilevel degenerative changes present within the visualized spine. Sequelae of prior vertebral augmentation present within the upper lumbar spine. No acute osseous abnormality.  IMPRESSION: 1. No active cardiopulmonary disease. 2. Mild subsegmental atelectasis at the left lung base. 3. Stable cardiomegaly without pulmonary edema.   Electronically Signed   By: Rise MuBenjamin  McClintock M.D.   On: 09/19/2014 01:04    CBC  Recent Labs Lab 09/18/14 2355 09/19/14 0001 09/19/14 0930 09/20/14 0520  WBC 15.5*  --  12.2* 9.5  HGB 12.2* 12.6* 11.8* 10.8*  HCT 36.5* 37.0* 35.6* 32.1*  PLT 153  --  150 129*  MCV 98.9  --  101.7* 99.4  MCH 33.1  --  33.7 33.4  MCHC 33.4  --  33.1 33.6  RDW 14.0  --  14.2 14.1  LYMPHSABS 0.4*  --   --   --   MONOABS 1.5*  --   --   --   EOSABS 0.0  --   --   --   BASOSABS 0.0  --   --   --     Chemistries   Recent Labs Lab 09/19/14 0001 09/19/14 0930 09/20/14 0520  NA 138 140 140  K 4.2 3.9 4.0  CL 106 109 110  CO2  --  24 21*  GLUCOSE 162* 154* 107*  BUN 30* 30* 24*  CREATININE 2.50* 2.54* 1.58*  CALCIUM  --  8.6* 8.2*    ------------------------------------------------------------------------------------------------------------------ estimated creatinine clearance is 43.4 mL/min (by C-G formula based on Cr of 1.58). ------------------------------------------------------------------------------------------------------------------ No results for input(s): HGBA1C in the last 72 hours. ------------------------------------------------------------------------------------------------------------------ No results for input(s): CHOL, HDL, LDLCALC,  TRIG, CHOLHDL, LDLDIRECT in the last 72 hours. ------------------------------------------------------------------------------------------------------------------ No results for input(s): TSH, T4TOTAL, T3FREE, THYROIDAB in the last 72 hours.  Invalid input(s): FREET3 ------------------------------------------------------------------------------------------------------------------ No results for input(s): VITAMINB12, FOLATE, FERRITIN, TIBC, IRON, RETICCTPCT in the last 72 hours.  Coagulation profile No results for input(s): INR, PROTIME in the last 168 hours.  No results for input(s): DDIMER in the last 72 hours.  Cardiac Enzymes No results for input(s): CKMB, TROPONINI, MYOGLOBIN in the last 168 hours.  Invalid input(s): CK ------------------------------------------------------------------------------------------------------------------ Invalid input(s): POCBNP   Recent Labs  09/18/14 2351  GLUCAP 148*     Inita Uram M.D. [facility] 09/20/2014, 12:16 PM  Pager: [phone]   Between 7am to 7pm - call Pager - [phone]  After 7pm go to [email or web address] - password [email or web address]  Call night coverage person covering after 7pm

## 2014-09-21 ENCOUNTER — Inpatient Hospital Stay (HOSPITAL_COMMUNITY): Payer: Medicare Other

## 2014-09-21 DIAGNOSIS — R296 Repeated falls: Secondary | ICD-10-CM

## 2014-09-21 DIAGNOSIS — R531 Weakness: Secondary | ICD-10-CM

## 2014-09-21 LAB — BASIC METABOLIC PANEL
Anion gap: 7 (ref 5–15)
BUN: 16 mg/dL (ref 6–20)
CHLORIDE: 112 mmol/L — AB (ref 101–111)
CO2: 22 mmol/L (ref 22–32)
Calcium: 8.2 mg/dL — ABNORMAL LOW (ref 8.9–10.3)
Creatinine, Ser: 1.05 mg/dL (ref 0.61–1.24)
GFR calc Af Amer: >60 mL/min (ref >60)
GFR calc non Af Amer: >60 mL/min (ref >60)
GLUCOSE: 116 mg/dL — AB (ref 65–99)
POTASSIUM: 4.2 mmol/L (ref 3.5–5.1)
SODIUM: 141 mmol/L (ref 135–145)

## 2014-09-21 LAB — CBC
HCT: 33.1 % — ABNORMAL LOW (ref 39.0–52.0)
HEMOGLOBIN: 11.1 g/dL — AB (ref 13.0–17.0)
MCH: 33.8 pg (ref 26.0–34.0)
MCHC: 33.5 g/dL (ref 30.0–36.0)
MCV: 100.9 fL — ABNORMAL HIGH (ref 78.0–100.0)
PLATELETS: 165 10*3/uL (ref 150–400)
RBC: 3.28 MIL/uL — ABNORMAL LOW (ref 4.22–5.81)
RDW: 14.2 % (ref 11.5–15.5)
WBC: 8.5 10*3/uL (ref 4.0–10.5)

## 2014-09-21 NOTE — Progress Notes (Addendum)
[facility]                                                                              Patient Demographics  Alexander Randolph, is a 81 y.o. male, DOB - 09-23-1931, EAV:[id number]  Admit date - 09/18/2014   Admitting Physician Haydee Monicaachal A David, MD  Outpatient Primary MD for the patient is Gaspar GarbeISOVEC,RICHARD W, MD  LOS - 2   Chief Complaint  Patient presents with  . Fall       Brief HPI   81 year old patient with dementia, hypertension, hyperlipidemia, CAD, COPD, AAA, orthostatic hypotension, brought in by family due to generalized weakness, 2 falls at home. Per patient's wife he was getting weak when he tried to ambulate and not acting normally, more confused than normal. Patient was admitted for further workup.   Assessment & Plan    Principal Problem:   AKI (acute kidney injury) with lactic acidosis, dehydration superimposed on chronic kidney disease, stage III -Recommend function resolved, patient tolerating diet without any difficulty, discontinue IV fluids    Active Problems: Right shoulder pain - X-ray of the right shoulder showed no acute or subacute abnormality, continue PTOT and in control    AAA (abdominal aortic aneurysm) without rupture -Follows Dr. Hart [place] outpatient  Urinary tract infection - Unfortunately no urine culture available, continue continue IV Rocephin   generalized weakness with dementia and falls - PTOT evaluation, currently at home with his wife - Start PT evaluation today, patient is much more alert, discussed in detail with patient's wife, okay with skilled nursing facility if needed    CAD S/P percutaneous coronary angioplasty - Continue aspirin, Plavix, statin    Orthostatic hypotension - Continue midodrine  Advanced dementia   continue Namenda, Aricept  Hyperlipidemia: continue Zocor  Code Status: Full code   Family Communication: Discussed in detail with the patient, all imaging results, lab results explained to the  patient and wife at the bedside   Disposition Plan: Patient is much more alert and awake today, repeat PT, social worker consult also placed, patient's wife okay with rehabilitation  Time Spent in minutes 25 minutes  Procedures  None   Consults   None   DVT Prophylaxis  SCD's  Medications  Scheduled Meds: . aspirin EC  81 mg Oral Daily  . cefTRIAXone (ROCEPHIN)  IV  1 g Intravenous Q24H  . clopidogrel  75 mg Oral Daily  . donepezil  10 mg Oral QHS  . memantine  28 mg Oral Daily  . midodrine  2.5 mg Oral Daily  . pantoprazole  40 mg Oral Daily  . simvastatin  40 mg Oral Daily   Continuous Infusions:   PRN Meds:.   Antibiotics   Anti-infectives    Start     Dose/Rate Route Frequency Ordered Stop   09/20/14 0200  cefTRIAXone (ROCEPHIN) 1 g in dextrose 5 % 50 mL IVPB - Premix     1 g 100 mL/hr over 30 Minutes Intravenous Every 24 hours 09/19/14 0858     09/19/14 0215  cefTRIAXone (ROCEPHIN) 1 g in dextrose 5 % 50 mL IVPB     1 g 100  mL/hr over 30 Minutes Intravenous  Once 09/19/14 0201 09/19/14 0310        Subjective:   Alexander Randolph was seen and examined today. Much more alert and awake and tolerating diet, ate more than 80%. Complaining of right shoulder pain on raising his arm. Denies any fevers, chills, chest pain, shortness of breath, nausea or vomiting. No new weakness numbness or tingling. No acute events overnight  Objective:   Blood pressure 140/60, pulse 85, temperature 98.6 F (37 C), temperature source Oral, resp. rate 16, height  (1.88 m), weight 91.2 kg (201 lb 1 oz), SpO2 97 %.  Wt Readings from Last 3 Encounters:  09/19/14 91.2 kg (201 lb 1 oz)  07/27/13 87.635 kg (193 lb 3.2 oz)  07/09/13 87.544 kg (193 lb)     Intake/Output Summary (Last 24 hours) at 09/21/14 0901 Last data filed at 09/21/14 1610  Gross per 24 hour  Intake      0 ml  Output   1100 ml  Net  -1100 ml    Exam  General: Alert and oriented 3, wife at the bedside,  NAD  HEENT:  PERRLA, EOMI  Neck: Supple, no JVD, no masses  CVS: S1 S2 auscultated, no rubs, murmurs or gallops. Regular rate and rhythm.  Respiratory: Clear to auscultation bilaterally, no wheezing, rales or rhonchi  Abdomen: Soft, nontender, nondistended, + bowel sounds  Ext: no cyanosis clubbing or edema, ROM decreased on the right shoulder on abduction  Neuro: alert and oriented, no focal neurological deficits  Skin: No rashes  Psych: Alert and oriented, normal affect   Data Review   Micro Results No results found for this or any previous visit (from the past 240 hour(s)).  Radiology Reports Dg Chest 2 View  09/19/2014   CLINICAL DATA:  Initial evaluation for acute weakness. History of hypertension, COPD.  EXAM: CHEST  2 VIEW  COMPARISON:  None.  FINDINGS: Mild cardiomegaly is stable from previous exam. Mediastinal silhouette within normal limits. Tortuosity the intrathoracic aorta noted. Scattered vascular calcifications present within the aortic arch. Tracheal air column mildly bowed to the right at the level of the aortic arch, stable.  Lungs are normally inflated. Mild linear opacities at the left lung base most compatible with subsegmental atelectasis. No focal infiltrates identified. No pulmonary edema or pleural effusion. No pneumothorax.  Multilevel degenerative changes present within the visualized spine. Sequelae of prior vertebral augmentation present within the upper lumbar spine. No acute osseous abnormality.  IMPRESSION: 1. No active cardiopulmonary disease. 2. Mild subsegmental atelectasis at the left lung base. 3. Stable cardiomegaly without pulmonary edema.   Electronically Signed   By: Rise Mu M.D.   On: 09/19/2014 01:04    CBC  Recent Labs Lab 09/18/14 2355 09/19/14 0001 09/19/14 0930 09/20/14 0520 09/21/14 0520  WBC 15.5*  --  12.2* 9.5 8.5  HGB 12.2* 12.6* 11.8* 10.8* 11.1*  HCT 36.5* 37.0* 35.6* 32.1* 33.1*  PLT 153  --  150 129* 165    MCV 98.9  --  101.7* 99.4 100.9*  MCH 33.1  --  33.7 33.4 33.8  MCHC 33.4  --  33.1 33.6 33.5  RDW 14.0  --  14.2 14.1 14.2  LYMPHSABS 0.4*  --   --   --   --   MONOABS 1.5*  --   --   --   --   EOSABS 0.0  --   --   --   --   BASOSABS 0.0  --   --   --   --  Chemistries   Recent Labs Lab 09/19/14 0001 09/19/14 0930 09/20/14 0520 09/21/14 0520  NA 138 140 140 141  K 4.2 3.9 4.0 4.2  CL 106 109 110 112*  CO2  --  24 21* 22  GLUCOSE 162* 154* 107* 116*  BUN 30* 30* 24* 16  CREATININE 2.50* 2.54* 1.58* 1.05  CALCIUM  --  8.6* 8.2* 8.2*   ------------------------------------------------------------------------------------------------------------------ estimated creatinine clearance is 65.2 mL/min (by C-G formula based on Cr of 1.05). ------------------------------------------------------------------------------------------------------------------ No results for input(s): HGBA1C in the last 72 hours. ------------------------------------------------------------------------------------------------------------------ No results for input(s): CHOL, HDL, LDLCALC, TRIG, CHOLHDL, LDLDIRECT in the last 72 hours. ------------------------------------------------------------------------------------------------------------------ No results for input(s): TSH, T4TOTAL, T3FREE, THYROIDAB in the last 72 hours.  Invalid input(s): FREET3 ------------------------------------------------------------------------------------------------------------------ No results for input(s): VITAMINB12, FOLATE, FERRITIN, TIBC, IRON, RETICCTPCT in the last 72 hours.  Coagulation profile No results for input(s): INR, PROTIME in the last 168 hours.  No results for input(s): DDIMER in the last 72 hours.  Cardiac Enzymes No results for input(s): CKMB, TROPONINI, MYOGLOBIN in the last 168 hours.  Invalid input(s):  CK ------------------------------------------------------------------------------------------------------------------ Invalid input(s): POCBNP   Recent Labs  09/18/14 2351  GLUCAP 148*     Glendale Wherry M.D. [facility] 09/21/2014, 9:01 AM  Pager: [phone]   Between 7am to 7pm - call Pager - [phone]  After 7pm go to [email or web address] - password [email or web address]  Call night coverage person covering after 7pm

## 2014-09-21 NOTE — Clinical Social Work Placement (Signed)
   CLINICAL SOCIAL WORK PLACEMENT  NOTE  Date:  09/21/2014  Patient Details  Name: Alexander Randolph MRN: [id number] Date of Birth: April 01, 1932  Clinical Social Work is seeking post-discharge placement for this patient at the Skilled  Nursing Facility level of care (*CSW will initial, date and re-position this form in  chart as items are completed):  Yes   Patient/family provided with [facility] Clinical Social Work Department's list of facilities offering this level of care within the geographic area requested by the patient (or if unable, by the patient's family).  Yes   Patient/family informed of their freedom to choose among providers that offer the needed level of care, that participate in Medicare, Medicaid or managed care program needed by the patient, have an available bed and are willing to accept the patient.  Yes   Patient/family informed of [facility]'s ownership interest in [facility] and [facility], as well as of the fact that they are under no obligation to receive care at these facilities.  PASRR submitted to EDS on 09/21/14     PASRR number received on 09/21/14     Existing PASRR number confirmed on       FL2 transmitted to all facilities in geographic area requested by pt/family on 09/21/14     FL2 transmitted to all facilities within larger geographic area on       Patient informed that his/her managed care company has contracts with or will negotiate with certain facilities, including the following:            Patient/family informed of bed offers received.  Patient chooses bed at       Physician recommends and patient chooses bed at      Patient to be transferred to   on  .  Patient to be transferred to facility by       Patient family notified on   of transfer.  Name of family member notified:        PHYSICIAN       Additional Comment:    _______________________________________________ Liliana Clinealdwell, Evarose Altland Parks, LCSW 09/21/2014, 11:25 AM

## 2014-09-22 DIAGNOSIS — N179 Acute kidney failure, unspecified: Secondary | ICD-10-CM

## 2014-09-22 LAB — BASIC METABOLIC PANEL
Anion gap: 8 (ref 5–15)
BUN: 17 mg/dL (ref 6–20)
CO2: 23 mmol/L (ref 22–32)
Calcium: 8.3 mg/dL — ABNORMAL LOW (ref 8.9–10.3)
Chloride: 110 mmol/L (ref 101–111)
Creatinine, Ser: 1.08 mg/dL (ref 0.61–1.24)
GFR calc non Af Amer: >60 mL/min (ref >60)
Glucose, Bld: 93 mg/dL (ref 65–99)
POTASSIUM: 4 mmol/L (ref 3.5–5.1)
Sodium: 141 mmol/L (ref 135–145)

## 2014-09-22 LAB — CBC
HCT: 30.9 % — ABNORMAL LOW (ref 39.0–52.0)
HEMOGLOBIN: 10.2 g/dL — AB (ref 13.0–17.0)
MCH: 33.4 pg (ref 26.0–34.0)
MCHC: 33 g/dL (ref 30.0–36.0)
MCV: 101.3 fL — ABNORMAL HIGH (ref 78.0–100.0)
PLATELETS: 162 10*3/uL (ref 150–400)
RBC: 3.05 MIL/uL — ABNORMAL LOW (ref 4.22–5.81)
RDW: 14.2 % (ref 11.5–15.5)
WBC: 8.5 10*3/uL (ref 4.0–10.5)

## 2014-09-22 MED ORDER — LEVOFLOXACIN 500 MG PO TABS: 500.0000 mg | ORAL_TABLET | Freq: Every day | ORAL | Status: DC

## 2014-09-22 NOTE — Discharge Summary (Signed)
Physician Discharge Summary  Alexander Randolph EAV:[id number] DOB: [birth year] DOA: 09/18/2014  PCP: Gaspar Garbe, MD  Admit date: 09/18/2014 Discharge date: 09/22/2014  Recommendations for Outpatient Follow-up:  1. Pt will need to follow up with PCP in 2-3 weeks post discharge 2. Please obtain BMP to evaluate electrolytes and kidney function 3. Please also check CBC to evaluate Hg and Hct levels 4. Continue Levaquin for 5 more days post discharge   Discharge Diagnoses:  Principal Problems:   AKI (acute kidney injury)   AAA (abdominal aortic aneurysm) without rupture   CAD S/P percutaneous coronary angioplasty   Orthostatic hypotension   Dementia   Falls frequently   UTI (lower urinary tract infection)   CKD (chronic kidney disease)  Discharge Condition: Stable  Diet recommendation: Heart healthy diet discussed in details   History of present illness:   81 year old patient with dementia, hypertension, hyperlipidemia, CAD, COPD, AAA, orthostatic hypotension, brought in by family due to generalized weakness, 2 falls at home. Per patient's wife he was getting weak when he tried to ambulate and not acting normally, more confused than normal. Patient was admitted for further workup.  Assessment & Plan   Principal Problem:  AKI (acute kidney injury) with lactic acidosis, dehydration superimposed on chronic kidney disease, stage III - IVF have been provided, pt with better oral intake, Cr is now WNL - repeat BMP in AM prior to discharge   Active Problems: Right shoulder pain - X-ray of the right shoulder showed no acute or subacute abnormality, continue PTOT and pain control  AAA (abdominal aortic aneurysm) without rupture -Follows Dr. Hart Rochester outpatient  Urinary tract infection - Unfortunately no urine culture available, continue Levaquin upon discharge   Generalized weakness with dementia and falls - PTOT evaluation, currently at home with his wife but after discussion  with family, d/c to SNF in AM - ambulate while inpatient, OOB to chair as pt able to tolerate   CAD S/P percutaneous coronary angioplasty - Continue aspirin, Plavix, statin  Orthostatic hypotension - Continue midodrine  Advanced dementia  continue Namenda, Aricept  Code Status: Full code   Family Communication: Discussed in detail with wife at bedside  Disposition Plan: SNF in AM 09/23/2014  Procedures/Studies: Dg Chest 2 View  09/19/2014  No active cardiopulmonary disease. 2. Mild subsegmental atelectasis at the left lung base. 3. Stable cardiomegaly without pulmonary edema.   Dg Shoulder Right  09/21/2014    No acute or subacute osseous abnormality. 2. Calcification projected between the humeral head and the acromion. This may be within the supraspinatus tendon or may be a loose body in the joint capsule. 3. Narrowed subacromial space consistent with chronic supraspinatus tendon disease. 4. Degenerative changes involving the AC joint. 5. Osseous demineralization.     Discharge Exam: Filed Vitals:   09/22/14 0957  BP: 115/49  Pulse: 69  Temp: 99.1 F (37.3 C)  Resp: 16   Filed Vitals:   09/21/14 1356 09/21/14 2102 09/22/14 0508 09/22/14 0957  BP: 148/66 145/64 132/65 115/49  Pulse: 86 83 70 69  Temp: 98.3 F (36.8 C) 98.7 F (37.1 C) 98.4 F (36.9 C) 99.1 F (37.3 C)  TempSrc: Oral Oral Oral Axillary  Resp: Height:      Weight:      SpO2: 100% 97% 96% 96%    General: Pt is alert, follows commands appropriately, not in acute distress Cardiovascular: Regular rate and rhythm, no rubs, no gallops Respiratory: Clear to auscultation bilaterally, no  wheezing, no crackles, no rhonchi Abdominal: Soft, non tender, non distended, bowel sounds +, no guarding   Discharge Instructions     Medication List    STOP taking these medications        cephALEXin 500 MG capsule  Commonly known as:  KEFLEX      TAKE these medications        aspirin 81 MG tablet   Take 81 mg by mouth daily.     clopidogrel 75 MG tablet  Commonly known as:  PLAVIX  Pt must make and appointment for additional refills     donepezil 10 MG tablet  Commonly known as:  ARICEPT  Take 10 mg by mouth at bedtime.     levofloxacin 500 MG tablet  Commonly known as:  LEVAQUIN  Take 1 tablet (500 mg total) by mouth daily.     midodrine 2.5 MG tablet  Commonly known as:  PROAMATINE  Take 2.5 mg by mouth daily.     NAMENDA XR 28 MG Cp24 24 hr capsule  Generic drug:  memantine  Take 28 mg by mouth daily.     pantoprazole 40 MG tablet  Commonly known as:  PROTONIX  Take 40 mg by mouth daily.     simvastatin 40 MG tablet  Commonly known as:  ZOCOR  Take 40 mg by mouth daily.           Follow-up Information    Follow up with Gaspar GarbeISOVEC,RICHARD W, MD.   Specialty:  Internal Medicine   Contact information:   [address] [place] [address] [phone]        The results of significant diagnostics from this hospitalization (including imaging, microbiology, ancillary and laboratory) are listed below for reference.     Microbiology: No results found for this or any previous visit (from the past 240 hour(s)).   Labs: Basic Metabolic Panel:  Recent Labs Lab 09/19/14 0001 09/19/14 0930 09/20/14 0520 09/21/14 0520 09/22/14 0545  NA 138 140 140 141 141  K 4.2 3.9 4.0 4.2 4.0  CL 106 109 110 112* 110  CO2  --  24 21* 22 23  GLUCOSE 162* 154* 107* 116* 93  BUN 30* 30* 24* 16 17  CREATININE 2.50* 2.54* 1.58* 1.05 1.08  CALCIUM  --  8.6* 8.2* 8.2* 8.3*   CBC:  Recent Labs Lab 09/18/14 2355 09/19/14 0001 09/19/14 0930 09/20/14 0520 09/21/14 0520 09/22/14 0545  WBC 15.5*  --  12.2* 9.5 8.5 8.5  NEUTROABS 13.6*  --   --   --   --   --   HGB 12.2* 12.6* 11.8* 10.8* 11.1* 10.2*  HCT 36.5* 37.0* 35.6* 32.1* 33.1* 30.9*  MCV 98.9  --  101.7* 99.4 100.9* 101.3*  PLT 153  --  150 129* 165 162    CBG:  Recent Labs Lab 09/18/14 2351   GLUCAP 148*     SIGNED: Time coordinating discharge: 30 minutes  MAGICK-Chaselynn Kepple, MD  [facility] 09/22/2014, 12:15 PM Pager [phone]  If 7PM-7AM, please contact night-coverage [email or web address] Password [email or web address]

## 2014-09-22 NOTE — Progress Notes (Signed)
CSW has made wife aware of SNF offers and she has selected [facility]. [employer] Berkley Harveyauth is underway and CSW will follow up tomorrow for further dc planning.  Wife is quite pleased with offer~ Reece LevyJanet Xadrian Craighead, MSW, Theresia MajorsLCSWA [phone]

## 2014-09-22 NOTE — Clinical Social Work Note (Signed)
Clinical Social Work Assessment  Patient Details  Name: Alexander Randolph MRN: [id number] Date of Birth: May 09, 1931  Date of referral:  09/21/14               Reason for consult:  Facility Placement                Permission sought to share information with:  Family Supports Permission granted to share information::  Yes, Verbal Permission Granted  Name::      (wife- )  Agency::   (area SNF's)  Relationship::     Contact Information:     Housing/Transportation Living arrangements for the past 2 months:  Single Family Home Source of Information:  Spouse Patient Interpreter Needed:  None Criminal Activity/Legal Involvement Pertinent to Current Situation/Hospitalization:  No - Comment as needed Significant Relationships:  Adult Children, Spouse, Community Support Lives with:    Do you feel safe going back to the place where you live?  No Need for family participation in patient care:  Yes (Comment)  Care giving concerns:  PT recommending SNF   Social Worker assessment / plan:  CSW met with wife of 81 years to discuss SNF placement. Wife reports she is his caregiver at home due to his dementia- she had placed him at [facility]/memory care for about 13 days but states; "that was a mistake". She is open to pursuing ST SNF for him at dc and feels this will be beneficial for helping to regain his strength, mobility and her ability to care for him at home.  Employment status:  Retired Nurse, adult PT Recommendations:    Information / Referral to community resources:  [place]  Patient/Family's Response to care:  Wife is open to SNF- wants to pursue something close to their home if possible- she is aware of the bed search process as well as his [facility] which will require SNF auth.   Patient/Family's Understanding of and Emotional Response to Diagnosis, Current Treatment, and Prognosis:  Wife is realistic about his care needs and  overall limitations given his overall health and memory issues.   Emotional Assessment Appearance:  Appears stated age Attitude/Demeanor/Rapport:  Unable to Assess Affect (typically observed):  Unable to Assess Orientation:    Alcohol / Substance use:  Not Applicable Psych involvement (Current and /or in the community):  No (Comment)  Discharge Needs  Concerns to be addressed:    Readmission within the last 30 days:  No Current discharge risk:  Dependent with Mobility Barriers to Discharge:  No Barriers Identified   Ludwig Clarks, LCSW 09/22/2014, 11:54 AM

## 2014-09-22 NOTE — Progress Notes (Signed)
Auth received [id number] for 5 days starting on 6/9 and review date of 6/13. Thearpy code RVB.   Olga CoasterKristen Avary Eichenberger, LCSW  Clinical Social Work  [facility] [phone]

## 2014-09-22 NOTE — Progress Notes (Signed)
CSW provided patient's wife withy current SNF bed offers- she is considering her options as well as waiting to hear on a few possible other options/offers. MD visited patient during CSW visit and is anticipating dc tomorrow to SNF. Will update once SNF bed selection is made.   Reece LevyJanet Naydeline Morace, MSW, Theresia MajorsLCSWA [phone]

## 2014-09-22 NOTE — Progress Notes (Signed)
Physical Therapy Treatment Patient Details Name: Alexander Randolph MRN: [id number] DOB: 11/05/1931 Today's Date: 09/22/2014    History of Present Illness 81 year old patient with dementia, hypertension, hyperlipidemia, CAD, COPD, AAA, orthostatic hypotension, brought in by family due to generalized weakness, 2 falls at home. Per patient's wife he was getting weak when he tried to ambulate and not acting normally, more confused than normal    PT Comments    Pt in bed sleeping with sitter in room.  Difficult to arouse. Was able to get from supine to EOB using simple one word cueing.  Assisted with amb using B platform EVA walker for increased safety.  Assisted back to bed.  Pt required + 2 assist.   Follow Up Recommendations  SNF  [facility]   Equipment Recommendations  None recommended by PT    Recommendations for Other Services       Precautions / Restrictions Precautions Precautions: Fall Precaution Comments: Hx Dementia Restrictions Weight Bearing Restrictions: No    Mobility  Bed Mobility Overal bed mobility: Needs Assistance Bed Mobility: Supine to Sit;Sit to Supine     Supine to sit: +2 for safety/equipment;Mod assist     General bed mobility comments: MAX simple functioanl cueing to assist pt to EOB.  Able to support self upright.  Assisted back to bed same fashion, simple one word cueing and assist with B LE's up onto bed.   Transfers Overall transfer level: Needs assistance Equipment used: Bilateral platform walker (EVA walker) Transfers: Sit to/from Stand Sit to Stand: Independent;+2 physical assistance;+2 safety/equipment;Total assist         General transfer comment: + 2 total assist pt 15%  Ambulation/Gait Ambulation/Gait assistance: +2 physical assistance;+2 safety/equipment;Mod assist Ambulation Distance (Feet): 18 Feet Assistive device: Bilateral platform walker Gait Pattern/deviations: Step-to pattern;Step-through pattern;Trunk  flexed Gait velocity: decreased   General Gait Details: used EVA walker for increased safety.  Very unsteady gait.  HIGH FALL RISK.   Stairs            Wheelchair Mobility    Modified Rankin (Stroke Patients Only)       Balance                                    Cognition Arousal/Alertness: Awake/alert (difficult to arouse initially) Behavior During Therapy: WFL for tasks assessed/performed                        Exercises      General Comments        Pertinent Vitals/Pain Pain Assessment: No/denies pain    Home Living                      Prior Function            PT Goals (current goals can now be found in the care plan section) Progress towards PT goals: Progressing toward goals    Frequency  Min 3X/week    PT Plan      Co-evaluation             End of Session Equipment Utilized During Treatment: Gait belt Activity Tolerance: Patient limited by lethargy Patient left: in bed;with call bell/phone within reach;with family/visitor present;with nursing/sitter in room     Time: [id number] PT Time Calculation (min) (ACUTE ONLY): 26 min  Charges:  $Gait Training: 8-22 mins $  Therapeutic Activity: 8-22 mins                    G Codes:      Rica Koyanagi  PTA [facility]  Acute  Rehab Pager      [phone]

## 2014-09-22 NOTE — Discharge Instructions (Signed)

## 2014-09-23 LAB — BASIC METABOLIC PANEL
ANION GAP: 7 (ref 5–15)
BUN: 17 mg/dL (ref 6–20)
CO2: 24 mmol/L (ref 22–32)
CREATININE: 1.1 mg/dL (ref 0.61–1.24)
Calcium: 8.2 mg/dL — ABNORMAL LOW (ref 8.9–10.3)
Chloride: 108 mmol/L (ref 101–111)
GFR calc Af Amer: >60 mL/min (ref >60)
GFR calc non Af Amer: >60 mL/min (ref >60)
Glucose, Bld: 104 mg/dL — ABNORMAL HIGH (ref 65–99)
POTASSIUM: 4 mmol/L (ref 3.5–5.1)
Sodium: 139 mmol/L (ref 135–145)

## 2014-09-23 LAB — CBC
HEMATOCRIT: 30.8 % — AB (ref 39.0–52.0)
Hemoglobin: 10.2 g/dL — ABNORMAL LOW (ref 13.0–17.0)
MCH: 33 pg (ref 26.0–34.0)
MCHC: 33.1 g/dL (ref 30.0–36.0)
MCV: 99.7 fL (ref 78.0–100.0)
PLATELETS: 177 10*3/uL (ref 150–400)
RBC: 3.09 MIL/uL — ABNORMAL LOW (ref 4.22–5.81)
RDW: 14.1 % (ref 11.5–15.5)
WBC: 6.9 10*3/uL (ref 4.0–10.5)

## 2014-09-23 NOTE — Progress Notes (Signed)
Report called to [facility]. Family is here to take him. Lashala Laser, [employer]

## 2014-09-23 NOTE — Care Management Note (Signed)
Case Management Note  Patient Details  Name: Alexander Randolph MRN: [id number] Date of Birth: 1931/12/10  Subjective/Objective:      81 yo male admitted with AKI              Action/Plan: CSW assisting with SNF placement  Expected Discharge Date:  09/22/14               Expected Discharge Plan:  Skilled Nursing Facility  In-House Referral:  Clinical Social Work  Discharge planning Services  CM Consult  Post Acute Care Choice:    Choice offered to:     DME Arranged:    DME Agency:     HH Arranged:    HH Agency:     Status of Service:  Completed, signed off  Medicare Important Message Given:  Yes Date Medicare IM Given:  09/23/14 Medicare IM give by:  Aileen Pilot Date Additional Medicare IM Given:    Additional Medicare Important Message give by:     If discussed at Long Length of Stay Meetings, dates discussed:    Additional Comments:  Gerrit Halls, RN 09/23/2014, 1:38 PM

## 2014-09-23 NOTE — Progress Notes (Signed)
Patient for d/c today to SNF bed at [facility]. Wife and patient agreeable to this plan- plan transfer via family car. [name], MSW, Theresia Majors [phone]

## 2014-09-23 NOTE — Progress Notes (Signed)
Pt seen and examined at bedside, sleepy this AM. Was in chair yesterday and tolerated well, waiting for breakfast. Plan to d/c to SNF today. D/C summary done 09/22/14, no changes needed.  Debbora Presto, MD  [facility] Pager [phone]  If 7PM-7AM, please contact night-coverage [email or web address] Password [email or web address]

## 2014-09-23 NOTE — Clinical Social Work Placement (Signed)
   CLINICAL SOCIAL WORK PLACEMENT  NOTE  Date:  09/23/2014  Patient Details  Name: Alexander Randolph MRN: [id number] Date of Birth: Dec 14, 1931  Clinical Social Work is seeking post-discharge placement for this patient at the Skilled  Nursing Facility level of care (*CSW will initial, date and re-position this form in  chart as items are completed):  Yes   Patient/family provided with [place] Clinical Social Work Department's list of facilities offering this level of care within the geographic area requested by the patient (or if unable, by the patient's family).  Yes   Patient/family informed of their freedom to choose among providers that offer the needed level of care, that participate in Medicare, Medicaid or managed care program needed by the patient, have an available bed and are willing to accept the patient.  Yes   Patient/family informed of [facility]'s ownership interest in [facility] and [facility], as well as of the fact that they are under no obligation to receive care at these facilities.  PASRR submitted to EDS on 09/21/14     PASRR number received on 09/21/14     Existing PASRR number confirmed on       FL2 transmitted to all facilities in geographic area requested by pt/family on 09/21/14     FL2 transmitted to all facilities within larger geographic area on 09/22/14     Patient informed that his/her managed care company has contracts with or will negotiate with certain facilities, including the following:        Yes   Patient/family informed of bed offers received.  Patient chooses bed at  [facility])     Physician recommends and patient chooses bed at      Patient to be transferred to  [facility] [address]) on 09/23/14.  Patient to be transferred to facility by  (car)     Patient family notified on 09/23/14 (wife) of transfer.  Name of family member notified:   (wife)     PHYSICIAN       Additional Comment:     _______________________________________________ Liliana Cline, LCSW 09/23/2014, 1:43 PM

## 2014-10-14 ENCOUNTER — Other Ambulatory Visit (HOSPITAL_COMMUNITY): Payer: Medicare Other

## 2014-10-14 ENCOUNTER — Ambulatory Visit: Payer: Medicare Other | Admitting: Family

## 2014-10-19 ENCOUNTER — Encounter: Payer: Self-pay | Admitting: Family

## 2014-10-22 ENCOUNTER — Inpatient Hospital Stay (HOSPITAL_COMMUNITY): Admission: RE | Admit: 2014-10-22 | Payer: Medicare Other | Source: Ambulatory Visit

## 2014-10-22 ENCOUNTER — Ambulatory Visit: Payer: Medicare Other | Admitting: Family

## 2015-04-10 ENCOUNTER — Emergency Department (HOSPITAL_COMMUNITY): Payer: Medicare Other

## 2015-04-10 ENCOUNTER — Inpatient Hospital Stay (HOSPITAL_COMMUNITY)
Admission: EM | Admit: 2015-04-10 | Discharge: 2015-04-12 | DRG: 640 | Disposition: A | Discharge status: Skilled Nursing Facility | Payer: Medicare Other | Attending: Internal Medicine | Admitting: Internal Medicine

## 2015-04-10 ENCOUNTER — Encounter (HOSPITAL_COMMUNITY): Payer: Self-pay | Admitting: Radiology

## 2015-04-10 DIAGNOSIS — I129 Hypertensive chronic kidney disease with stage 1 through stage 4 chronic kidney disease, or unspecified chronic kidney disease: Secondary | ICD-10-CM | POA: Diagnosis present

## 2015-04-10 DIAGNOSIS — E876 Hypokalemia: Secondary | ICD-10-CM | POA: Diagnosis present

## 2015-04-10 DIAGNOSIS — Z7902 Long term (current) use of antithrombotics/antiplatelets: Secondary | ICD-10-CM | POA: Diagnosis not present

## 2015-04-10 DIAGNOSIS — J449 Chronic obstructive pulmonary disease, unspecified: Secondary | ICD-10-CM | POA: Diagnosis present

## 2015-04-10 DIAGNOSIS — Z87891 Personal history of nicotine dependence: Secondary | ICD-10-CM | POA: Diagnosis not present

## 2015-04-10 DIAGNOSIS — E860 Dehydration: Secondary | ICD-10-CM | POA: Diagnosis present

## 2015-04-10 DIAGNOSIS — I714 Abdominal aortic aneurysm, without rupture, unspecified: Secondary | ICD-10-CM | POA: Diagnosis present

## 2015-04-10 DIAGNOSIS — Z7982 Long term (current) use of aspirin: Secondary | ICD-10-CM

## 2015-04-10 DIAGNOSIS — E870 Hyperosmolality and hypernatremia: Principal | ICD-10-CM | POA: Diagnosis present

## 2015-04-10 DIAGNOSIS — I739 Peripheral vascular disease, unspecified: Secondary | ICD-10-CM | POA: Diagnosis present

## 2015-04-10 DIAGNOSIS — Z809 Family history of malignant neoplasm, unspecified: Secondary | ICD-10-CM | POA: Diagnosis not present

## 2015-04-10 DIAGNOSIS — R296 Repeated falls: Secondary | ICD-10-CM

## 2015-04-10 DIAGNOSIS — G9340 Encephalopathy, unspecified: Secondary | ICD-10-CM | POA: Diagnosis present

## 2015-04-10 DIAGNOSIS — Z825 Family history of asthma and other chronic lower respiratory diseases: Secondary | ICD-10-CM

## 2015-04-10 DIAGNOSIS — F419 Anxiety disorder, unspecified: Secondary | ICD-10-CM | POA: Diagnosis present

## 2015-04-10 DIAGNOSIS — I951 Orthostatic hypotension: Secondary | ICD-10-CM | POA: Diagnosis not present

## 2015-04-10 DIAGNOSIS — F0391 Unspecified dementia with behavioral disturbance: Secondary | ICD-10-CM | POA: Diagnosis not present

## 2015-04-10 DIAGNOSIS — G629 Polyneuropathy, unspecified: Secondary | ICD-10-CM | POA: Diagnosis present

## 2015-04-10 DIAGNOSIS — F0390 Unspecified dementia without behavioral disturbance: Secondary | ICD-10-CM | POA: Diagnosis present

## 2015-04-10 DIAGNOSIS — Z8249 Family history of ischemic heart disease and other diseases of the circulatory system: Secondary | ICD-10-CM

## 2015-04-10 DIAGNOSIS — Z66 Do not resuscitate: Secondary | ICD-10-CM | POA: Diagnosis present

## 2015-04-10 DIAGNOSIS — N183 Chronic kidney disease, stage 3 unspecified: Secondary | ICD-10-CM | POA: Diagnosis present

## 2015-04-10 DIAGNOSIS — N179 Acute kidney failure, unspecified: Secondary | ICD-10-CM | POA: Diagnosis not present

## 2015-04-10 DIAGNOSIS — E785 Hyperlipidemia, unspecified: Secondary | ICD-10-CM | POA: Diagnosis present

## 2015-04-10 DIAGNOSIS — R0902 Hypoxemia: Secondary | ICD-10-CM | POA: Diagnosis present

## 2015-04-10 DIAGNOSIS — F09 Unspecified mental disorder due to known physiological condition: Secondary | ICD-10-CM | POA: Diagnosis present

## 2015-04-10 DIAGNOSIS — Z79899 Other long term (current) drug therapy: Secondary | ICD-10-CM | POA: Diagnosis not present

## 2015-04-10 DIAGNOSIS — F329 Major depressive disorder, single episode, unspecified: Secondary | ICD-10-CM | POA: Diagnosis present

## 2015-04-10 DIAGNOSIS — A419 Sepsis, unspecified organism: Secondary | ICD-10-CM

## 2015-04-10 DIAGNOSIS — Z955 Presence of coronary angioplasty implant and graft: Secondary | ICD-10-CM | POA: Diagnosis not present

## 2015-04-10 DIAGNOSIS — I2510 Atherosclerotic heart disease of native coronary artery without angina pectoris: Secondary | ICD-10-CM | POA: Diagnosis present

## 2015-04-10 DIAGNOSIS — R4182 Altered mental status, unspecified: Secondary | ICD-10-CM | POA: Diagnosis not present

## 2015-04-10 LAB — COMPREHENSIVE METABOLIC PANEL
ALK PHOS: 77 U/L (ref 38–126)
ALT: 14 U/L — ABNORMAL LOW (ref 17–63)
ANION GAP: 11 (ref 5–15)
AST: 23 U/L (ref 15–41)
Albumin: 3.5 g/dL (ref 3.5–5.0)
BUN: 41 mg/dL — ABNORMAL HIGH (ref 6–20)
CALCIUM: 9.2 mg/dL (ref 8.9–10.3)
CO2: 25 mmol/L (ref 22–32)
Chloride: 120 mmol/L — ABNORMAL HIGH (ref 101–111)
Creatinine, Ser: 1.82 mg/dL — ABNORMAL HIGH (ref 0.61–1.24)
GFR calc non Af Amer: 33 mL/min — ABNORMAL LOW (ref >60)
GFR, EST AFRICAN AMERICAN: 38 mL/min — AB (ref >60)
Glucose, Bld: 116 mg/dL — ABNORMAL HIGH (ref 65–99)
POTASSIUM: 4.1 mmol/L (ref 3.5–5.1)
SODIUM: 156 mmol/L — AB (ref 135–145)
Total Bilirubin: 1.1 mg/dL (ref 0.3–1.2)
Total Protein: 7.3 g/dL (ref 6.5–8.1)

## 2015-04-10 LAB — PROTIME-INR
INR: 1.12 (ref 0.00–1.49)
PROTHROMBIN TIME: 14.6 s (ref 11.6–15.2)

## 2015-04-10 LAB — CREATININE, SERUM
CREATININE: 1.7 mg/dL — AB (ref 0.61–1.24)
GFR calc Af Amer: 42 mL/min — ABNORMAL LOW (ref >60)
GFR, EST NON AFRICAN AMERICAN: 36 mL/min — AB (ref >60)

## 2015-04-10 LAB — URINALYSIS, ROUTINE W REFLEX MICROSCOPIC
Glucose, UA: NEGATIVE mg/dL
HGB URINE DIPSTICK: NEGATIVE
Ketones, ur: 15 mg/dL — AB
NITRITE: NEGATIVE
PROTEIN: 30 mg/dL — AB
SPECIFIC GRAVITY, URINE: 1.03 (ref 1.005–1.030)
pH: 5.5 (ref 5.0–8.0)

## 2015-04-10 LAB — CBC WITH DIFFERENTIAL/PLATELET
BASOS ABS: 0 10*3/uL (ref 0.0–0.1)
BASOS PCT: 0 %
Eosinophils Absolute: 0 10*3/uL (ref 0.0–0.7)
Eosinophils Relative: 0 %
HEMATOCRIT: 34.4 % — AB (ref 39.0–52.0)
Hemoglobin: 11.3 g/dL — ABNORMAL LOW (ref 13.0–17.0)
Lymphocytes Relative: 13 %
Lymphs Abs: 1 10*3/uL (ref 0.7–4.0)
MCH: 34.8 pg — ABNORMAL HIGH (ref 26.0–34.0)
MCHC: 32.8 g/dL (ref 30.0–36.0)
MCV: 105.8 fL — ABNORMAL HIGH (ref 78.0–100.0)
MONOS PCT: 6 %
Monocytes Absolute: 0.5 10*3/uL (ref 0.1–1.0)
NEUTROS ABS: 6.5 10*3/uL (ref 1.7–7.7)
NEUTROS PCT: 81 %
Platelets: 169 10*3/uL (ref 150–400)
RBC: 3.25 MIL/uL — ABNORMAL LOW (ref 4.22–5.81)
RDW: 15.8 % — ABNORMAL HIGH (ref 11.5–15.5)
WBC: 8 10*3/uL (ref 4.0–10.5)

## 2015-04-10 LAB — URINE MICROSCOPIC-ADD ON

## 2015-04-10 LAB — CBC
HCT: 37.8 % — ABNORMAL LOW (ref 39.0–52.0)
HEMOGLOBIN: 11.9 g/dL — AB (ref 13.0–17.0)
MCH: 34.1 pg — AB (ref 26.0–34.0)
MCHC: 31.5 g/dL (ref 30.0–36.0)
MCV: 108.3 fL — ABNORMAL HIGH (ref 78.0–100.0)
PLATELETS: 198 10*3/uL (ref 150–400)
RBC: 3.49 MIL/uL — AB (ref 4.22–5.81)
RDW: 15.9 % — ABNORMAL HIGH (ref 11.5–15.5)
WBC: 8.4 10*3/uL (ref 4.0–10.5)

## 2015-04-10 LAB — BLOOD GAS, ARTERIAL
ACID-BASE EXCESS: 0.2 mmol/L (ref 0.0–2.0)
Bicarbonate: 24.3 mEq/L — ABNORMAL HIGH (ref 20.0–24.0)
DRAWN BY: 103701
O2 CONTENT: 2 L/min
O2 SAT: 93 %
PATIENT TEMPERATURE: 99
PCO2 ART: 39.9 mmHg (ref 35.0–45.0)
PO2 ART: 71.8 mmHg — AB (ref 80.0–100.0)
TCO2: 22.1 mmol/L (ref 0–100)
pH, Arterial: 7.403 (ref 7.350–7.450)

## 2015-04-10 LAB — PROCALCITONIN: Procalcitonin: 0.7 ng/mL

## 2015-04-10 LAB — LACTIC ACID, PLASMA
LACTIC ACID, VENOUS: 1.1 mmol/L (ref 0.5–2.0)
Lactic Acid, Venous: 1.1 mmol/L (ref 0.5–2.0)

## 2015-04-10 LAB — APTT: aPTT: 29 seconds (ref 24–37)

## 2015-04-10 LAB — TSH: TSH: 1.412 u[IU]/mL (ref 0.350–4.500)

## 2015-04-10 LAB — I-STAT CG4 LACTIC ACID, ED: LACTIC ACID, VENOUS: 1.86 mmol/L (ref 0.5–2.0)

## 2015-04-10 MED ORDER — ASPIRIN 81 MG PO TABS: 81.0000 mg | ORAL_TABLET | Freq: Every day | ORAL | Status: DC

## 2015-04-10 MED ORDER — VANCOMYCIN HCL IN DEXTROSE 1-5 GM/200ML-% IV SOLN
1000.0000 mg | INTRAVENOUS | Status: DC
  Administered 2015-04-11: 1000 mg via INTRAVENOUS
  Filled 2015-04-10 (×2): qty 200

## 2015-04-10 MED ORDER — PIPERACILLIN-TAZOBACTAM 3.375 G IVPB
3.3750 g | Freq: Three times a day (TID) | INTRAVENOUS | Status: DC
  Administered 2015-04-10 – 2015-04-12 (×5): 3.375 g via INTRAVENOUS
  Filled 2015-04-10 (×6): qty 50

## 2015-04-10 MED ORDER — ENOXAPARIN SODIUM 40 MG/0.4ML ~~LOC~~ SOLN
40.0000 mg | SUBCUTANEOUS | Status: DC
  Administered 2015-04-10 – 2015-04-11 (×2): 40 mg via SUBCUTANEOUS
  Filled 2015-04-10 (×3): qty 0.4

## 2015-04-10 MED ORDER — MIDODRINE HCL 2.5 MG PO TABS
2.5000 mg | ORAL_TABLET | Freq: Two times a day (BID) | ORAL | Status: DC
  Administered 2015-04-10 – 2015-04-12 (×5): 2.5 mg via ORAL
  Filled 2015-04-10 (×5): qty 1

## 2015-04-10 MED ORDER — SODIUM CHLORIDE 0.9 % IV SOLN
INTRAVENOUS | Status: DC
  Administered 2015-04-10 (×2): via INTRAVENOUS

## 2015-04-10 MED ORDER — SODIUM CHLORIDE 0.9 % IV BOLUS (SEPSIS)
500.0000 mL | Freq: Once | INTRAVENOUS | Status: AC
  Administered 2015-04-10: 500 mL via INTRAVENOUS

## 2015-04-10 MED ORDER — ASPIRIN EC 81 MG PO TBEC
81.0000 mg | DELAYED_RELEASE_TABLET | Freq: Every day | ORAL | Status: DC
  Administered 2015-04-12: 81 mg via ORAL
  Filled 2015-04-10 (×2): qty 1

## 2015-04-10 MED ORDER — SODIUM CHLORIDE 0.9 % IV SOLN
INTRAVENOUS | Status: DC
  Administered 2015-04-10: 11:00:00 via INTRAVENOUS

## 2015-04-10 MED ORDER — DIVALPROEX SODIUM ER 250 MG PO TB24
250.0000 mg | ORAL_TABLET | Freq: Every day | ORAL | Status: DC
  Administered 2015-04-10 – 2015-04-12 (×2): 250 mg via ORAL
  Filled 2015-04-10 (×3): qty 1

## 2015-04-10 MED ORDER — PIPERACILLIN-TAZOBACTAM 3.375 G IVPB 30 MIN
3.3750 g | Freq: Once | INTRAVENOUS | Status: AC
  Administered 2015-04-10: 3.375 g via INTRAVENOUS
  Filled 2015-04-10: qty 50

## 2015-04-10 MED ORDER — VANCOMYCIN HCL IN DEXTROSE 1-5 GM/200ML-% IV SOLN
1000.0000 mg | Freq: Once | INTRAVENOUS | Status: AC
  Administered 2015-04-10: 1000 mg via INTRAVENOUS
  Filled 2015-04-10: qty 200

## 2015-04-10 NOTE — ED Notes (Addendum)
Per [facility]- [facility] FULL CODE. Pt fever,cough DX with bronchitis. Frequent falls. Bruising healing from fall several days ago. Fell today- c- collar intact. 02 sats decreased on scene 4LNC applied for support RR 24.. Per staff pt went to transfer from his chair to the cough and fell to ground. NO LOC. Witnessed fall. EMS attempted to give oral tylenol for temp. 100.5 not successful.  Pt needing continuous suction in route. Upon arrival to room EDP Zackowski present. Pt with altered mental status. Not responding to EDP. Pt transferred to Res A

## 2015-04-10 NOTE — H&P (Addendum)
[facility] History and Physical  Alexander Randolph ZOX:[id number] DOB: [birth year] DOA: 04/10/2015  Referring physician: ED physician, Dr. Deretha Emory  PCP: Gaspar Garbe, MD   Chief Complaint: altered mental status   HPI:  Pt is 81 yo male with known dementia and resident of [facility], presented via EMS after found unresponsive at the facility. Please note that pt is unable to provide any history due to AMS and most of the information obtained from available records and ED doctor. Pt was apparently unresponsive in ED, resuscitated with IVF, 2 L NS and given vanc and zosyn. Became more alert and blood work showed severe hypernatremia. This change in mental status thought to be secondary to severe dehydration and ? Underlying sepsis of unclear source. TRH asked to admit to SDU for further evaluation and monitoring.   Assessment and Plan:  Principal Problem:   Acute encephalopathy - appears to be multifactorial and secondary to acute illness, dehydration, hypernatremia - admit to SDU - start with providing supportive care with IVF, empiric ABX for presumptive sepsis - close monitoring of clinical response - d/w family GOC within 24 - 48 hours   Active Problems:   Sepsis (HCC) - unclear source, sepsis work up initiated - broad spectrum ABX started - follow up on urine and blood culture, procalcitonin, lactic acid     Hypernatremia - appears to be pre renal in etiology - continue with IVF and repeat BMP in AM    Acute renal failure superimposed on stage 3 chronic kidney disease (HCC) - with baseline Cr 1.5 - 2.5 in the past 6 months - IVF as noted above - repeat BMP in AM    Orthostatic hypotension - continue Midodrine     Dementia - with frequent falls - PT/OT eval once pt more clinically stable     Abdominal aortic aneurysm (HCC) - outpatient follow up   Lovenox SQ for DVT prophylaxis   Radiological Exams on Admission: Ct Head Wo Contrast 04/10/2015  Negative for bleed or other acute intracranial process. 2. Atrophy and nonspecific white matter changes. 3. No acute cervical spine findings. 4. Multilevel cervical spondylitic changes. 5. Loss of the normal cervical spine lordosis, which may be secondary to positioning, spasm, or soft tissue injury.   Ct Cervical Spine Wo Contrast 04/10/2015 Negative for bleed or other acute intracranial process. 2. Atrophy and nonspecific white matter changes. 3. No acute cervical spine findings. 4. Multilevel cervical spondylitic changes. 5. Loss of the normal cervical spine lordosis, which may be secondary to positioning, spasm, or soft tissue injury. 2.   Dg Chest Port 1 View 04/10/2015 No active disease. Stable bilateral basilar atelectasis or scarring.    Code Status: Full Family Communication: No family at bedside  Disposition Plan: Admit for further evaluation    Danie Binder [facility] [phone]   Review of Systems:  Unable to obtain due to AMS   Past Medical History  Diagnosis Date  . Hypertension   . Hyperlipidemia   . Anxiety and depression   . Peripheral neuropathy (HCC)   . CAD S/P percutaneous coronary angioplasty     DES to RCA in 2007; subtotal occlusion of circumflex treated medically.  Marland Kitchen COPD (chronic obstructive pulmonary disease) (HCC)   . Dementia     severe organic brain syndrome; potentially exacerbated by alcoholism.  Marland Kitchen History of ETOH abuse   . AAA (abdominal aortic aneurysm) (HCC) 06/2009    ELG by Dr Hart Rochester - gore aortobifem graft  with common  iliac extender and prox extension  cuff of th EVAR.  Marland Kitchen. History of fracture of fibula 2010    TREATED BY Dr Ranell PatrickNorris  . Orthostatic hypotension     Suspected of having Shy-Drager synrome in the past.  . PVD (peripheral vascular disease) [facility](HCC)     Past Surgical History  Procedure Laterality Date  . Nm myoview ltd  07/20/2009    low risk scan; EF 63%  . Cardiac catheterization  03/22/2006    95% DISTAL PORTION OF RCA; MODERATE  INFRARENAL ANEURYSM,EF 50% WUTH INFERIOR HYPOKINESIS  . Percutaneous coronary stent intervention (pci-s)  03/30/2006    RCA -mid PCI- Cypher DES stent 2.5 mm x 18 mm, RCA-proximal -PCI - CYPHER DES STENT 2.5 mm x 23 mm  . Transthoracic echocardiogram  07/28/2012    LV EF 50-55%  . Abd aortc duplex   06/03/2012    AORTA AND ENDOGRAFT PATENT  . Abdominal  aorta doppler  06/16/2009    INFRARENAL AORTA  DILATATION TRANVERSE 5.5 x 5.3 x 5.0;patient refused CT  patient saw Dr Alanda AmassWeintraub 07/01/2009  . Abdominal aortic aneurysm repair  Aug 25, 2009    Endograft placed - Dr. Quita SkyeJames D. Lawson,--Gore aortobifem graft with common iliac extender and proximal extension cuff of the EVAR    Social History:  reports that he has quit smoking. He has never used smokeless tobacco. He reports that he does not drink alcohol or use illicit drugs.  No Known Allergies  Family History  Problem Relation Age of Onset  . Cancer Mother   . Heart disease Father     Heart Disease before age 79  . Heart attack Father   . COPD Father     Prior to Admission medications   Medication Sig Start Date End Date Taking? Authorizing Provider  albuterol (PROVENTIL) (2.5 MG/3ML) 0.083% nebulizer solution Take 3 mLs by nebulization every 8 (eight) hours. x7 days 04/09/15  Yes Historical Provider, MD  amoxicillin-clavulanate (AUGMENTIN) 875-125 MG tablet Take 1 tablet by mouth 2 (two) times daily. 04/07/15  Yes Historical Provider, MD  aspirin 81 MG tablet Take 81 mg by mouth daily.   Yes Historical Provider, MD  clopidogrel (PLAVIX) 75 MG tablet Pt must make and appointment for additional refills 09/02/14  Yes Runell GessJonathan J Berry, MD  divalproex (DEPAKOTE ER) 250 MG 24 hr tablet Take 250 mg by mouth daily.   Yes Historical Provider, MD  donepezil (ARICEPT) 10 MG tablet Take 10 mg by mouth at bedtime.  06/17/14  Yes Historical Provider, MD  LORazepam (ATIVAN) 1 MG tablet Take 1 mg by mouth every 12 (twelve) hours as needed for anxiety.    Yes Historical Provider, MD  Memantine HCl ER (NAMENDA XR) 28 MG CP24 Take 28 mg by mouth daily.    Yes Historical Provider, MD  midodrine (PROAMATINE) 2.5 MG tablet Take 2.5 mg by mouth 2 (two) times daily.    Yes Historical Provider, MD  NON FORMULARY Take 3 oz by mouth 3 (three) times daily.   Yes Historical Provider, MD  levofloxacin (LEVAQUIN) 500 MG tablet Take 1 tablet (500 mg total) by mouth daily. Patient not taking: Reported on 04/10/2015 09/22/14   Dorothea OgleIskra M Cyana Shook, MD  simvastatin (ZOCOR) 40 MG tablet Take 40 mg by mouth daily.    Historical Provider, MD    Physical Exam: Filed Vitals:   04/10/15 1115 04/10/15 1145 04/10/15 1200 04/10/15 1217  BP:  133/63 144/62   Pulse: 69 66 65   Temp:  98.8 F (37.1 C)     TempSrc:      Resp: Height:     (1.93 m)  Weight:    76.658 kg (169 lb)  SpO2: 97% 93% 94%     Physical Exam  Constitutional: Appears calm, NAD HENT: Normocephalic. External right and left ear normal. Very dry MM Eyes: Conjunctivae and EOM are normal. PERRLA, no scleral icterus.  Neck: Normal ROM. Neck supple. No JVD. No tracheal deviation. No thyromegaly.  CVS: RRR, no gallops, no carotid bruit.  Pulmonary: Effort and breath sounds normal, diminished breath sounds at bases  Abdominal: Soft. BS +,  no distension, tenderness, rebound or guarding.  Musculoskeletal: Normal range of motion. No edema and no tenderness.  Lymphadenopathy: No lymphadenopathy noted, cervical, inguinal. Neuro: Alert. CN appear intact Skin: Skin is warm and dry. No rash noted. Not diaphoretic. No erythema. No pallor.  Psychiatric: calm at this time   Labs on Admission:  Basic Metabolic Panel:  Recent Labs Lab 04/10/15 1105  NA 156*  K 4.1  CL 120*  CO2 25  GLUCOSE 116*  BUN 41*  CREATININE 1.82*  CALCIUM 9.2   Liver Function Tests:  Recent Labs Lab 04/10/15 1105  AST 23  ALT 14*  ALKPHOS 77  BILITOT 1.1  PROT 7.3  ALBUMIN 3.5   CBC:  Recent Labs Lab  04/10/15 1105  WBC 8.0  NEUTROABS 6.5  HGB 11.3*  HCT 34.4*  MCV 105.8*  PLT 169   EKG: pending    If 7PM-7AM, please contact night-coverage [email or web address] Password [facility] 04/10/2015, 12:33 PM

## 2015-04-10 NOTE — ED Notes (Signed)
Daughter at bedside and reported that pt is a DNR

## 2015-04-10 NOTE — ED Notes (Signed)
Awake. Verbally responsive. A/O x4. Resp even and unlabored. No audible adventitious breath sounds noted. ABC's intact. SR on monitor. IV x2 saline lock patent and intact and IV infusing NS at 1575ml/hr without difficulty. Family at bedside.

## 2015-04-10 NOTE — Progress Notes (Signed)
ANTIBIOTIC CONSULT NOTE - INITIAL  Pharmacy Consult for vancomycin and zosyn Indication: rule out sepsis  No Known Allergies  Patient Measurements: weight 77 kg, height 74 inches   Vital Signs: Temp: 98.8 F (37.1 C) (12/25 1115) Temp Source: Rectal (12/25 1109) BP: 133/63 mmHg (12/25 1145) Pulse Rate: 66 (12/25 1145) Intake/Output from previous day:   Intake/Output from this shift: Total I/O In: -  Out: 50 [Urine:50]  Labs:  Recent Labs  04/10/15 1105  WBC 8.0  HGB 11.3*  PLT 169  CREATININE 1.82*   CrCl cannot be calculated (Unknown ideal weight.). No results for input(s): VANCOTROUGH, VANCOPEAK, VANCORANDOM, GENTTROUGH, GENTPEAK, GENTRANDOM, TOBRATROUGH, TOBRAPEAK, TOBRARND, AMIKACINPEAK, AMIKACINTROU, AMIKACIN in the last 72 hours.   Microbiology: No results found for this or any previous visit (from the past 720 hour(s)).  Medical History: Past Medical History  Diagnosis Date  . Hypertension   . Hyperlipidemia   . Anxiety and depression   . Peripheral neuropathy (HCC)   . CAD S/P percutaneous coronary angioplasty     DES to RCA in 2007; subtotal occlusion of circumflex treated medically.  Marland Kitchen. COPD (chronic obstructive pulmonary disease) (HCC)   . Dementia     severe organic brain syndrome; potentially exacerbated by alcoholism.  Marland Kitchen. History of ETOH abuse   . AAA (abdominal aortic aneurysm) (HCC) 06/2009    ELG by Dr Hart RochesterLawson - gore aortobifem graft  with common iliac extender and prox extension  cuff of th EVAR.  Marland Kitchen. History of fracture of fibula 2010    TREATED BY Dr Ranell PatrickNorris  . Orthostatic hypotension     Suspected of having Shy-Drager synrome in the past.  . PVD (peripheral vascular disease) [facility](HCC)     Assessment: Patient's an 81 y.o M [facility] c/o fever and cough. To start broad abx for empiric coverage.   - scr 1.82 (crcl~34), LA 1.86 - 12/25 CXR: no active dz  12/25 vanc>> 12/25 zosyn>>   Goal of  Therapy:  Vancomycin trough level 15-20 mcg/ml  Plan:  - zosyn 3.375 gm IV q8h (infuse over 4 hours) - vancomycin 1000 mg IV q24h  Parys Elenbaas P 04/10/2015,12:06 PM

## 2015-04-10 NOTE — ED Notes (Signed)
Pt can go to floor at 13:07, Alexander Randolph

## 2015-04-10 NOTE — ED Notes (Signed)
Bed: [id number] Expected date:  Expected time:  Means of arrival:  Comments: EMS- 81 yo fever;cough

## 2015-04-10 NOTE — ED Provider Notes (Addendum)
CSN: [id number]     Arrival date & time 04/10/15  1026 History   First MD Initiated Contact with Patient 04/10/15 1030     Chief Complaint  Patient presents with  . Fall  . Fever  . Altered Mental Status  . Cough     (Consider location/radiation/quality/duration/timing/severity/associated sxs/prior Treatment) Patient is a 81 y.o. male presenting with fall, fever, altered mental status, and cough. The history is provided by the EMS personnel and the nursing home.  Fall  Fever Associated symptoms: cough   Altered Mental Status Associated symptoms: fever   Cough Associated symptoms: fever    patient sent in by EMS from [facility] nursing home. Patient is a full code. Home reported patient's had fever and cough and was diagnosed with bronchitis also has a history of frequent falls. Had a fall right prior to EMS picking him up. EMS was called out for respiratory difficulties. Patient according to EMS had O2 sats below 90%. They were placed on 4 L manual cannula. Temp reported at nursing facility was originally 105 but then re-evaluated and changed to 100.5. Patient arrived unresponsive.  Past Medical History  Diagnosis Date  . Hypertension   . Hyperlipidemia   . Anxiety and depression   . Peripheral neuropathy (HCC)   . CAD S/P percutaneous coronary angioplasty     DES to RCA in 2007; subtotal occlusion of circumflex treated medically.  Marland Kitchen COPD (chronic obstructive pulmonary disease) (HCC)   . Dementia     severe organic brain syndrome; potentially exacerbated by alcoholism.  Marland Kitchen History of ETOH abuse   . AAA (abdominal aortic aneurysm) (HCC) 06/2009    ELG by Dr Hart Rochester - gore aortobifem graft  with common iliac extender and prox extension  cuff of th EVAR.  Marland Kitchen History of fracture of fibula 2010    TREATED BY Dr Ranell Patrick  . Orthostatic hypotension     Suspected of having Shy-Drager synrome in the past.  . PVD (peripheral vascular disease) [facility])    Past Surgical History  Procedure  Laterality Date  . Nm myoview ltd  07/20/2009    low risk scan; EF 63%  . Cardiac catheterization  03/22/2006    95% DISTAL PORTION OF RCA; MODERATE INFRARENAL ANEURYSM,EF 50% WUTH INFERIOR HYPOKINESIS  . Percutaneous coronary stent intervention (pci-s)  03/30/2006    RCA -mid PCI- Cypher DES stent 2.5 mm x 18 mm, RCA-proximal -PCI - CYPHER DES STENT 2.5 mm x 23 mm  . Transthoracic echocardiogram  07/28/2012    LV EF 50-55%  . Abd aortc duplex   06/03/2012    AORTA AND ENDOGRAFT PATENT  . Abdominal  aorta doppler  06/16/2009    INFRARENAL AORTA  DILATATION TRANVERSE 5.5 x 5.3 x 5.0;patient refused CT  patient saw Dr Alanda Amass 07/01/2009  . Abdominal aortic aneurysm repair  Aug 25, 2009    Endograft placed - Dr. Quita Skye. Lawson,--Gore aortobifem graft with common iliac extender and proximal extension cuff of the EVAR   Family History  Problem Relation Age of Onset  . Cancer Mother   . Heart disease Father     Heart Disease before age 11  . Heart attack Father   . COPD Father    Social History  Substance Use Topics  . Smoking status: Former Games developer  . Smokeless tobacco: Never Used  . Alcohol Use: No    Review of Systems  Unable to perform ROS: Mental status change  Constitutional: Positive for fever.  Respiratory: Positive for  cough.       Allergies  Review of patient's allergies indicates no known allergies.  Home Medications   Prior to Admission medications   Medication Sig Start Date End Date Taking? Authorizing Provider  albuterol (PROVENTIL) (2.5 MG/3ML) 0.083% nebulizer solution Take 3 mLs by nebulization every 8 (eight) hours. x7 days 04/09/15  Yes Historical Provider, MD  amoxicillin-clavulanate (AUGMENTIN) 875-125 MG tablet Take 1 tablet by mouth 2 (two) times daily. 04/07/15  Yes Historical Provider, MD  aspirin 81 MG tablet Take 81 mg by mouth daily.   Yes Historical Provider, MD  clopidogrel (PLAVIX) 75 MG tablet Pt must make and appointment for additional  refills 09/02/14  Yes Runell Gess, MD  divalproex (DEPAKOTE ER) 250 MG 24 hr tablet Take 250 mg by mouth daily.   Yes Historical Provider, MD  donepezil (ARICEPT) 10 MG tablet Take 10 mg by mouth at bedtime.  06/17/14  Yes Historical Provider, MD  LORazepam (ATIVAN) 1 MG tablet Take 1 mg by mouth every 12 (twelve) hours as needed for anxiety.   Yes Historical Provider, MD  Memantine HCl ER (NAMENDA XR) 28 MG CP24 Take 28 mg by mouth daily.    Yes Historical Provider, MD  midodrine (PROAMATINE) 2.5 MG tablet Take 2.5 mg by mouth 2 (two) times daily.    Yes Historical Provider, MD  NON FORMULARY Take 3 oz by mouth 3 (three) times daily.   Yes Historical Provider, MD  levofloxacin (LEVAQUIN) 500 MG tablet Take 1 tablet (500 mg total) by mouth daily. Patient not taking: Reported on 04/10/2015 09/22/14   Dorothea Ogle, MD  simvastatin (ZOCOR) 40 MG tablet Take 40 mg by mouth daily.    Historical Provider, MD   BP 144/62 mmHg  Pulse 65  Temp(Src) 98.8 F (37.1 C) (Rectal)  Resp 20  Ht  (1.93 m)  Wt 76.658 kg  BMI 20.58 kg/m2  SpO2 94% Physical Exam  Constitutional: He appears well-developed and well-nourished. He appears distressed.  HENT:  Old yellowish bruising to the left side of the face. Mucous membranes extremely dry.  Neck: Normal range of motion. Neck supple.  Cardiovascular: Normal rate, regular rhythm and normal heart sounds.   No murmur heard. Pulmonary/Chest: He is in respiratory distress. He has rales.  Abdominal: Bowel sounds are normal. There is no tenderness.  Musculoskeletal: Normal range of motion.  Old bruising to left shoulder left hip.  Neurological:  Unresponsive.  Skin: Skin is warm. No rash noted.  Nursing note and vitals reviewed.   ED Course  Procedures (including critical care time) Labs Review Labs Reviewed  COMPREHENSIVE METABOLIC PANEL - Abnormal; Notable for the following:    Sodium 156 (*)    Chloride 120 (*)    Glucose, Bld 116 (*)    BUN  41 (*)    Creatinine, Ser 1.82 (*)    ALT 14 (*)    GFR calc non Af Amer 33 (*)    GFR calc Af Amer 38 (*)    All other components within normal limits  CBC WITH DIFFERENTIAL/PLATELET - Abnormal; Notable for the following:    RBC 3.25 (*)    Hemoglobin 11.3 (*)    HCT 34.4 (*)    MCV 105.8 (*)    MCH 34.8 (*)    RDW 15.8 (*)    All other components within normal limits  URINALYSIS, ROUTINE W REFLEX MICROSCOPIC (NOT AT [facility]) - Abnormal; Notable for the following:    Color, Urine  ORANGE (*)    APPearance CLOUDY (*)    Bilirubin Urine MODERATE (*)    Ketones, ur 15 (*)    Protein, ur 30 (*)    Leukocytes, UA TRACE (*)    All other components within normal limits  BLOOD GAS, ARTERIAL - Abnormal; Notable for the following:    pO2, Arterial 71.8 (*)    Bicarbonate 24.3 (*)    All other components within normal limits  URINE MICROSCOPIC-ADD ON - Abnormal; Notable for the following:    Squamous Epithelial / LPF 0-5 (*)    Bacteria, UA RARE (*)    Casts HYALINE CASTS (*)    All other components within normal limits  CULTURE, BLOOD (ROUTINE X 2)  CULTURE, BLOOD (ROUTINE X 2)  URINE CULTURE  I-STAT CG4 LACTIC ACID, ED   Results for orders placed or performed during the hospital encounter of 04/10/15  Comprehensive metabolic panel  Result Value Ref Range   Sodium 156 (H) 135 - 145 mmol/L   Potassium 4.1 3.5 - 5.1 mmol/L   Chloride 120 (H) 101 - 111 mmol/L   CO2 25 22 - 32 mmol/L   Glucose, Bld 116 (H) 65 - 99 mg/dL   BUN 41 (H) 6 - 20 mg/dL   Creatinine, Ser 1.61 (H) 0.61 - 1.24 mg/dL   Calcium 9.2 8.9 - 09.6 mg/dL   Total Protein 7.3 6.5 - 8.1 g/dL   Albumin 3.5 3.5 - 5.0 g/dL   AST 23 15 - 41 U/L   ALT 14 (L) 17 - 63 U/L   Alkaline Phosphatase 77 38 - 126 U/L   Total Bilirubin 1.1 0.3 - 1.2 mg/dL   GFR calc non Af Amer 33 (L) >60 mL/min   GFR calc Af Amer 38 (L) >60 mL/min   Anion gap 11 5 - 15  CBC WITH DIFFERENTIAL  Result Value Ref Range   WBC 8.0 4.0 - 10.5 K/uL    RBC 3.25 (L) 4.22 - 5.81 MIL/uL   Hemoglobin 11.3 (L) 13.0 - 17.0 g/dL   HCT 04.5 (L) 40.9 - 81.1 %   MCV 105.8 (H) 78.0 - 100.0 fL   MCH 34.8 (H) 26.0 - 34.0 pg   MCHC 32.8 30.0 - 36.0 g/dL   RDW 91.4 (H) 78.2 - 95.6 %   Platelets 169 150 - 400 K/uL   Neutrophils Relative % 81 %   Neutro Abs 6.5 1.7 - 7.7 K/uL   Lymphocytes Relative 13 %   Lymphs Abs 1.0 0.7 - 4.0 K/uL   Monocytes Relative 6 %   Monocytes Absolute 0.5 0.1 - 1.0 K/uL   Eosinophils Relative 0 %   Eosinophils Absolute 0.0 0.0 - 0.7 K/uL   Basophils Relative 0 %   Basophils Absolute 0.0 0.0 - 0.1 K/uL  Urinalysis, Routine w reflex microscopic (not at [facility])  Result Value Ref Range   Color, Urine ORANGE (A) YELLOW   APPearance CLOUDY (A) CLEAR   Specific Gravity, Urine 1.030 1.005 - 1.030   pH 5.5 5.0 - 8.0   Glucose, UA NEGATIVE NEGATIVE mg/dL   Hgb urine dipstick NEGATIVE NEGATIVE   Bilirubin Urine MODERATE (A) NEGATIVE   Ketones, ur 15 (A) NEGATIVE mg/dL   Protein, ur 30 (A) NEGATIVE mg/dL   Nitrite NEGATIVE NEGATIVE   Leukocytes, UA TRACE (A) NEGATIVE  Blood gas, arterial ([facility] ONLY)  Result Value Ref Range   O2 Content 2.0 L/min   Delivery systems NASAL CANNULA    pH,  Arterial 7.403 7.350 - 7.450   pCO2 arterial 39.9 35.0 - 45.0 mmHg   pO2, Arterial 71.8 (L) 80.0 - 100.0 mmHg   Bicarbonate 24.3 (H) 20.0 - 24.0 mEq/L   TCO2 22.1 0 - 100 mmol/L   Acid-Base Excess 0.2 0.0 - 2.0 mmol/L   O2 Saturation 93.0 %   Patient temperature 99.0    Collection site BRACHIAL ARTERY    Drawn by [id number]    Sample type ARTERIAL   Urine microscopic-add on  Result Value Ref Range   Squamous Epithelial / LPF 0-5 (A) NONE SEEN   WBC, UA 6-30 0 - 5 WBC/hpf   RBC / HPF 0-5 0 - 5 RBC/hpf   Bacteria, UA RARE (A) NONE SEEN   Casts HYALINE CASTS (A) NEGATIVE   Urine-Other MUCOUS PRESENT   I-Stat CG4 Lactic Acid, ED  (not at  [facility])  Result Value Ref Range   Lactic Acid, Venous 1.86 0.5 - 2.0 mmol/L     Imaging  Review Ct Head Wo Contrast  04/10/2015  CLINICAL DATA:  Frequent falls. Bruising healing from fall several days ago. Fell today- c- collar intact. 02 sats decreased on scene 4LNC APPLIED FOR SUPPORT RR 24. Per staff pt went to transfer from his chair to the cough and fell to ground. NO LOC Hx of dementia EXAM: CT HEAD WITHOUT CONTRAST CT CERVICAL SPINE WITHOUT CONTRAST TECHNIQUE: Multidetector CT imaging of the head and cervical spine was performed following the standard protocol without intravenous contrast. Multiplanar CT image reconstructions of the cervical spine were also generated. COMPARISON:  None. 01/28/2013 FINDINGS: CT HEAD FINDINGS Atherosclerotic and physiologic intracranial calcifications. Diffuse parenchymal atrophy. Patchy areas of hypoattenuation in deep and periventricular white matter bilaterally. Negative for acute intracranial hemorrhage, mass lesion, acute infarction, midline shift, or mass-effect. Acute infarct may be inapparent on noncontrast CT. Ventricles and sulci symmetric. Bone windows demonstrate no focal lesion. CT CERVICAL SPINE FINDINGS Mild reversal of the normal cervical lordosis. Narrowing of interspaces C3 C4-C7 with small anterior and posterior endplate spurs at all levels. No prevertebral soft tissue swelling. Facets are seated. Negative for fracture. Bilateral calcified carotid bifurcation plaque. Visualized lung apices clear. IMPRESSION: 1.   1.  Negative for bleed or other acute intracranial process. 2. Atrophy and nonspecific white matter changes. 3. No acute cervical spine findings. 4. Multilevel cervical spondylitic changes. 5. Loss of the normal cervical spine lordosis, which may be secondary to positioning, spasm, or soft tissue injury. 2. Electronically Signed   By: Corlis Leak  Hassell M.D.   On: 04/10/2015 11:39   Ct Cervical Spine Wo Contrast  04/10/2015  CLINICAL DATA:  Frequent falls. Bruising healing from fall several days ago. Fell today- c- collar intact. 02 sats  decreased on scene 4LNC APPLIED FOR SUPPORT RR 24. Per staff pt went to transfer from his chair to the cough and fell to ground. NO LOC Hx of dementia EXAM: CT HEAD WITHOUT CONTRAST CT CERVICAL SPINE WITHOUT CONTRAST TECHNIQUE: Multidetector CT imaging of the head and cervical spine was performed following the standard protocol without intravenous contrast. Multiplanar CT image reconstructions of the cervical spine were also generated. COMPARISON:  None. 01/28/2013 FINDINGS: CT HEAD FINDINGS Atherosclerotic and physiologic intracranial calcifications. Diffuse parenchymal atrophy. Patchy areas of hypoattenuation in deep and periventricular white matter bilaterally. Negative for acute intracranial hemorrhage, mass lesion, acute infarction, midline shift, or mass-effect. Acute infarct may be inapparent on noncontrast CT. Ventricles and sulci symmetric. Bone windows demonstrate no focal lesion. CT CERVICAL SPINE  FINDINGS Mild reversal of the normal cervical lordosis. Narrowing of interspaces C3 C4-C7 with small anterior and posterior endplate spurs at all levels. No prevertebral soft tissue swelling. Facets are seated. Negative for fracture. Bilateral calcified carotid bifurcation plaque. Visualized lung apices clear. IMPRESSION: 1.   1.  Negative for bleed or other acute intracranial process. 2. Atrophy and nonspecific white matter changes. 3. No acute cervical spine findings. 4. Multilevel cervical spondylitic changes. 5. Loss of the normal cervical spine lordosis, which may be secondary to positioning, spasm, or soft tissue injury. 2. Electronically Signed   By: Corlis Leak M.D.   On: 04/10/2015 11:39   Dg Chest Port 1 View  04/10/2015  CLINICAL DATA:  Cough, congestion, fever EXAM: PORTABLE CHEST 1 VIEW COMPARISON:  09/18/2014 FINDINGS: Cardiomediastinal silhouette is stable. No acute infiltrate or pleural effusion. No pulmonary edema. Stable bilateral basilar atelectasis or scarring. Degenerative changes  bilateral shoulders. IMPRESSION: No active disease. Stable bilateral basilar atelectasis or scarring. Electronically Signed   By: Natasha Mead M.D.   On: 04/10/2015 11:27   I have personally reviewed and evaluated these images and lab results as part of my medical decision-making.   EKG Interpretation   Date/Time:  Sunday April 10 2015 10:59:13 EST Ventricular Rate:  70 PR Interval:  170 QRS Duration: 147 QT Interval:  450 QTC Calculation: 486 R Axis:   89 Text Interpretation:  Sinus rhythm IVCD, consider atypical RBBB No  significant change since last tracing Confirmed by Layana Konkel  MD, Drake Wuertz  [phone]) on 04/10/2015 11:02:21 AM       CRITICAL CARE Performed by: Vanetta Mulders Total critical care time: 30 minutes Critical care time was exclusive of separately billable procedures and treating other patients. Critical care was necessary to treat or prevent imminent or life-threatening deterioration. Critical care was time spent personally by me on the following activities: development of treatment plan with patient and/or surrogate as well as nursing, discussions with consultants, evaluation of patient's response to treatment, examination of patient, obtaining history from patient or surrogate, ordering and performing treatments and interventions, ordering and review of laboratory studies, ordering and review of radiographic studies, pulse oximetry and re-evaluation of patient's condition.  MDM   Final diagnoses:  Hypernatremia  Dehydration    Patient brought in from [facility] is a full code. Patient supposedly had fever cough and bronchitis. Arrived unresponsive but apparently was responsive on his way in by EMS. Patient reportedly was hypoxic on room air arrived on 4 L nasal cannula with sats around 92%. Patient was moved to recess room because of being unresponsive upon getting the resource room was awake and answering questions.  Extensive workup here as well as  coverage for potential sepsis with broad-spectrum antibiotics and culturing lactic acid was not elevated. Chest x-ray without evidence of pneumonia. Labs are consistent with hypernatremia and significant dehydration. No significant fever here. Patient will require admission for continued hydration. Head CT negative patient also had CT of his neck due to a fall in the nursing home today. Those are also negative. Patient has bruising on his left face and left shoulder and left hip from a previous fall. These have been evaluated in the past.     Vanetta Mulders, MD 04/10/15 1244  Vanetta Mulders, MD 04/10/15 [phone]

## 2015-04-10 NOTE — ED Notes (Signed)
Awake. Verbally responsive. A/O x4. Resp even and unlabored. No audible adventitious breath sounds noted. ABC's intact. SR on monitor. IV x2 infusing Vancomycin and NS and saline lock patent and intact. F/C patent and intact draining yellow urine.

## 2015-04-11 ENCOUNTER — Encounter (HOSPITAL_COMMUNITY): Payer: Self-pay | Admitting: *Deleted

## 2015-04-11 DIAGNOSIS — N179 Acute kidney failure, unspecified: Secondary | ICD-10-CM

## 2015-04-11 DIAGNOSIS — N183 Chronic kidney disease, stage 3 (moderate): Secondary | ICD-10-CM

## 2015-04-11 DIAGNOSIS — I951 Orthostatic hypotension: Secondary | ICD-10-CM

## 2015-04-11 DIAGNOSIS — G9340 Encephalopathy, unspecified: Secondary | ICD-10-CM

## 2015-04-11 DIAGNOSIS — E870 Hyperosmolality and hypernatremia: Principal | ICD-10-CM

## 2015-04-11 DIAGNOSIS — F0391 Unspecified dementia with behavioral disturbance: Secondary | ICD-10-CM

## 2015-04-11 LAB — BASIC METABOLIC PANEL
ANION GAP: 11 (ref 5–15)
BUN: 31 mg/dL — ABNORMAL HIGH (ref 6–20)
CALCIUM: 8.5 mg/dL — AB (ref 8.9–10.3)
CO2: 26 mmol/L (ref 22–32)
Chloride: 118 mmol/L — ABNORMAL HIGH (ref 101–111)
Creatinine, Ser: 1.31 mg/dL — ABNORMAL HIGH (ref 0.61–1.24)
GFR, EST AFRICAN AMERICAN: 57 mL/min — AB (ref >60)
GFR, EST NON AFRICAN AMERICAN: 49 mL/min — AB (ref >60)
Glucose, Bld: 91 mg/dL (ref 65–99)
POTASSIUM: 3.8 mmol/L (ref 3.5–5.1)
Sodium: 155 mmol/L — ABNORMAL HIGH (ref 135–145)

## 2015-04-11 LAB — SODIUM
SODIUM: 154 mmol/L — AB (ref 135–145)
SODIUM: 157 mmol/L — AB (ref 135–145)
SODIUM: 151 mmol/L — AB (ref 135–145)

## 2015-04-11 LAB — URINE CULTURE: CULTURE: NO GROWTH

## 2015-04-11 LAB — CBC
HEMATOCRIT: 34.6 % — AB (ref 39.0–52.0)
HEMOGLOBIN: 11.1 g/dL — AB (ref 13.0–17.0)
MCH: 33.9 pg (ref 26.0–34.0)
MCHC: 32.1 g/dL (ref 30.0–36.0)
MCV: 105.8 fL — ABNORMAL HIGH (ref 78.0–100.0)
Platelets: 189 10*3/uL (ref 150–400)
RBC: 3.27 MIL/uL — AB (ref 4.22–5.81)
RDW: 15.6 % — ABNORMAL HIGH (ref 11.5–15.5)
WBC: 10.3 10*3/uL (ref 4.0–10.5)

## 2015-04-11 MED ORDER — DEXTROSE-NACL 5-0.2 % IV SOLN
INTRAVENOUS | Status: DC
  Administered 2015-04-11 – 2015-04-12 (×2): via INTRAVENOUS

## 2015-04-11 MED ORDER — ENSURE ENLIVE PO LIQD
237.0000 mL | Freq: Two times a day (BID) | ORAL | Status: DC
  Administered 2015-04-11 – 2015-04-12 (×3): 237 mL via ORAL

## 2015-04-11 MED ORDER — DEXTROSE-NACL 5-0.45 % IV SOLN
INTRAVENOUS | Status: DC
  Administered 2015-04-11: 08:00:00 via INTRAVENOUS

## 2015-04-11 NOTE — Patient Care Conference (Signed)
Pt seen. Full note to follow. Met with pt's daughter at bedside. Pt has DNR form at home found by family. Family at bedside confirms DNR wishes. Orders placed

## 2015-04-11 NOTE — Progress Notes (Signed)
[facility] PROGRESS NOTE  Alexander Randolph ZOX:[id number] DOB: 24-Mar-1932 DOA: 04/10/2015 PCP: Gaspar Garbeichard W Tisovec, MD  HPI/Brief narrative 81 yo male with known dementia and resident of [facility], presented via EMS after found unresponsive at the facility. Pt was found to have marked hypernatremia and was admitted.  Assessment/Plan:  Acute encephalopathy without sepsis - appears to be multifactorial and secondary to acute illness, dehydration, hypernatremia - Patient has been continued with IVF, empiric ABX were started, however no identifiable infectious source identified. Will d/c abx - close monitoring of clinical response - d/w family GOC within 24 - 48 hours    Sepsis ruled out -Afebrile. Not tachycardic. No leukocytosis. MS change likely secondary to hypernatremia - no identifiable infectious source - d/c empiric abx   Hypernatremia - appears to be pre renal in etiology - continue with IVF and repeat BMP in AM   Acute renal failure superimposed on stage 3 chronic kidney disease (HCC) - with baseline Cr 1.5 - 2.5 in the past 6 months - IVF as noted above   Orthostatic hypotension - continue Midodrine    Dementia - with frequent falls - PT/OT eval once pt more clinically stable    Abdominal aortic aneurysm (HCC) - outpatient follow up   Lovenox SQ for DVT prophylaxis   Code Status: DNR - see note. Confirmed with family  Family Communication: Pt in room, daughter at bedside Disposition Plan: Pending PT eval   Consultants:    Procedures:    Antibiotics: Anti-infectives    Start     Dose/Rate Route Frequency Ordered Stop   04/11/15 1200  vancomycin (VANCOCIN) IVPB 1000 mg/200 mL premix     1,000 mg 200 mL/hr over 60 Minutes Intravenous Every 24 hours 04/10/15 1226     04/10/15 1700  piperacillin-tazobactam (ZOSYN) IVPB 3.375 g     3.375 g 12.5 mL/hr over 240 Minutes Intravenous Every 8 hours 04/10/15 1219     04/10/15 1100   piperacillin-tazobactam (ZOSYN) IVPB 3.375 g     3.375 g 100 mL/hr over 30 Minutes Intravenous  Once 04/10/15 1046 04/10/15 1142   04/10/15 1100  vancomycin (VANCOCIN) IVPB 1000 mg/200 mL premix     1,000 mg 200 mL/hr over 60 Minutes Intravenous  Once 04/10/15 1046 04/10/15 1251      HPI/Subjective: Confused, asleep  Objective: Filed Vitals:   04/10/15 1334 04/10/15 1403 04/10/15 2024 04/11/15 0550  BP: 140/59  115/51 140/65  Pulse: 70  72 69  Temp: 97.4 F (36.3 C)  98.5 F (36.9 C) 97.8 F (36.6 C)  TempSrc: Oral  Oral Oral  Resp: 16  18 18   Height:  6\' 4"  (1.93 m)    Weight:  77.8 kg (171 lb 8.3 oz)  78.3 kg (172 lb 9.9 oz)  SpO2: 99%  96% 96%    Intake/Output Summary (Last 24 hours) at 04/11/15 1248 Last data filed at 04/11/15 0700  Gross per 24 hour  Intake 1368.75 ml  Output    575 ml  Net 793.75 ml   Filed Weights   04/10/15 1217 04/10/15 1403 04/11/15 0550  Weight: 76.658 kg (169 lb) 77.8 kg (171 lb 8.3 oz) 78.3 kg (172 lb 9.9 oz)    Exam:   General:  Asleep, arousable, confused  Cardiovascular: regular, s1, s2  Respiratory: normal resp effort, no wheezing  Abdomen: soft, nondistended   Musculoskeletal: perfused, no clubbing   Data Reviewed: Basic Metabolic Panel:  Recent Labs Lab 04/10/15 1105 04/10/15 1516 04/11/15  0525  NA 156*  --  155*  K 4.1  --  3.8  CL 120*  --  118*  CO2 25  --  26  GLUCOSE 116*  --  91  BUN 41*  --  31*  CREATININE 1.82* 1.70* 1.31*  CALCIUM 9.2  --  8.5*   Liver Function Tests:  Recent Labs Lab 04/10/15 1105  AST 23  ALT 14*  ALKPHOS 77  BILITOT 1.1  PROT 7.3  ALBUMIN 3.5   No results for input(s): LIPASE, AMYLASE in the last 168 hours. No results for input(s): AMMONIA in the last 168 hours. CBC:  Recent Labs Lab 04/10/15 1105 04/10/15 1516 04/11/15 0525  WBC 8.0 8.4 10.3  NEUTROABS 6.5  --   --   HGB 11.3* 11.9* 11.1*  HCT 34.4* 37.8* 34.6*  MCV 105.8* 108.3* 105.8*  PLT 169 198 189    Cardiac Enzymes: No results for input(s): CKTOTAL, CKMB, CKMBINDEX, TROPONINI in the last 168 hours. BNP (last 3 results) No results for input(s): BNP in the last 8760 hours.  ProBNP (last 3 results) No results for input(s): PROBNP in the last 8760 hours.  CBG: No results for input(s): GLUCAP in the last 168 hours.  Recent Results (from the past 240 hour(s))  Blood Culture (routine x 2)     Status: None (Preliminary result)   Collection Time: 04/10/15 11:00 AM  Result Value Ref Range Status   Specimen Description LEFT ANTECUBITAL  Final   Special Requests BOTTLES DRAWN AEROBIC AND ANAEROBIC 5CC  Final   Culture   Final    NO GROWTH < 24 HOURS Performed at [facility]    Report Status PENDING  Incomplete  Blood Culture (routine x 2)     Status: None (Preliminary result)   Collection Time: 04/10/15 11:05 AM  Result Value Ref Range Status   Specimen Description BLOOD LEFT ARM  Final   Special Requests BOTTLES DRAWN AEROBIC AND ANAEROBIC 4CC  Final   Culture   Final    NO GROWTH < 24 HOURS Performed at [facility]    Report Status PENDING  Incomplete  Urine culture     Status: None   Collection Time: 04/10/15 11:06 AM  Result Value Ref Range Status   Specimen Description URINE, CATHETERIZED  Final   Special Requests NONE  Final   Culture   Final    NO GROWTH 1 DAY Performed at [facility]    Report Status 04/11/2015 FINAL  Final     Studies: Ct Head Wo Contrast  04/10/2015  CLINICAL DATA:  Frequent falls. Bruising healing from fall several days ago. Fell today- c- collar intact. 02 sats decreased on scene 4LNC APPLIED FOR SUPPORT RR 24. Per staff pt went to transfer from his chair to the cough and fell to ground. NO LOC Hx of dementia EXAM: CT HEAD WITHOUT CONTRAST CT CERVICAL SPINE WITHOUT CONTRAST TECHNIQUE: Multidetector CT imaging of the head and cervical spine was performed following the standard protocol without intravenous contrast.  Multiplanar CT image reconstructions of the cervical spine were also generated. COMPARISON:  None. 01/28/2013 FINDINGS: CT HEAD FINDINGS Atherosclerotic and physiologic intracranial calcifications. Diffuse parenchymal atrophy. Patchy areas of hypoattenuation in deep and periventricular white matter bilaterally. Negative for acute intracranial hemorrhage, mass lesion, acute infarction, midline shift, or mass-effect. Acute infarct may be inapparent on noncontrast CT. Ventricles and sulci symmetric. Bone windows demonstrate no focal lesion. CT CERVICAL SPINE FINDINGS Mild reversal of the  normal cervical lordosis. Narrowing of interspaces C3 C4-C7 with small anterior and posterior endplate spurs at all levels. No prevertebral soft tissue swelling. Facets are seated. Negative for fracture. Bilateral calcified carotid bifurcation plaque. Visualized lung apices clear. IMPRESSION: 1.   1.  Negative for bleed or other acute intracranial process. 2. Atrophy and nonspecific white matter changes. 3. No acute cervical spine findings. 4. Multilevel cervical spondylitic changes. 5. Loss of the normal cervical spine lordosis, which may be secondary to positioning, spasm, or soft tissue injury. 2. Electronically Signed   By: Corlis Leak M.D.   On: 04/10/2015 11:39   Ct Cervical Spine Wo Contrast  04/10/2015  CLINICAL DATA:  Frequent falls. Bruising healing from fall several days ago. Fell today- c- collar intact. 02 sats decreased on scene 4LNC APPLIED FOR SUPPORT RR 24. Per staff pt went to transfer from his chair to the cough and fell to ground. NO LOC Hx of dementia EXAM: CT HEAD WITHOUT CONTRAST CT CERVICAL SPINE WITHOUT CONTRAST TECHNIQUE: Multidetector CT imaging of the head and cervical spine was performed following the standard protocol without intravenous contrast. Multiplanar CT image reconstructions of the cervical spine were also generated. COMPARISON:  None. 01/28/2013 FINDINGS: CT HEAD FINDINGS Atherosclerotic and  physiologic intracranial calcifications. Diffuse parenchymal atrophy. Patchy areas of hypoattenuation in deep and periventricular white matter bilaterally. Negative for acute intracranial hemorrhage, mass lesion, acute infarction, midline shift, or mass-effect. Acute infarct may be inapparent on noncontrast CT. Ventricles and sulci symmetric. Bone windows demonstrate no focal lesion. CT CERVICAL SPINE FINDINGS Mild reversal of the normal cervical lordosis. Narrowing of interspaces C3 C4-C7 with small anterior and posterior endplate spurs at all levels. No prevertebral soft tissue swelling. Facets are seated. Negative for fracture. Bilateral calcified carotid bifurcation plaque. Visualized lung apices clear. IMPRESSION: 1.   1.  Negative for bleed or other acute intracranial process. 2. Atrophy and nonspecific white matter changes. 3. No acute cervical spine findings. 4. Multilevel cervical spondylitic changes. 5. Loss of the normal cervical spine lordosis, which may be secondary to positioning, spasm, or soft tissue injury. 2. Electronically Signed   By: Corlis Leak M.D.   On: 04/10/2015 11:39   Dg Chest Port 1 View  04/10/2015  CLINICAL DATA:  Cough, congestion, fever EXAM: PORTABLE CHEST 1 VIEW COMPARISON:  09/18/2014 FINDINGS: Cardiomediastinal silhouette is stable. No acute infiltrate or pleural effusion. No pulmonary edema. Stable bilateral basilar atelectasis or scarring. Degenerative changes bilateral shoulders. IMPRESSION: No active disease. Stable bilateral basilar atelectasis or scarring. Electronically Signed   By: Natasha Mead M.D.   On: 04/10/2015 11:27    Scheduled Meds: . aspirin EC  81 mg Oral Daily  . divalproex  250 mg Oral Daily  . enoxaparin (LOVENOX) injection  40 mg Subcutaneous Q24H  . midodrine  2.5 mg Oral BID WC  . piperacillin-tazobactam (ZOSYN)  IV  3.375 g Intravenous Q8H  . vancomycin  1,000 mg Intravenous Q24H   Continuous Infusions: . dextrose 5 % and 0.45% NaCl 75 mL/hr  at 04/11/15 1610    Principal Problem:   Acute encephalopathy Active Problems:   Abdominal aortic aneurysm (HCC)   Orthostatic hypotension   Dementia   Falls frequently   Sepsis (HCC)   Hypernatremia   Acute renal failure superimposed on stage 3 chronic kidney disease (HCC)    Caison Hearn K  [facility] Pager [phone]. If 7PM-7AM, please contact night-coverage at [email or web address], password [facility] 04/11/2015, 12:48 PM  LOS: 1 day

## 2015-04-11 NOTE — Plan of Care (Signed)
Problem: Pain Managment: Goal: General experience of comfort will improve Outcome: Progressing Pt not complaining of/appearing to be in any pain.

## 2015-04-11 NOTE — Care Management Note (Signed)
Case Management Note  Patient Details  Name: Lyn HenriWalter H Battershell MRN: [id number] Date of Birth: May 10, 1931  Subjective/Objective: 81 y/o m admitted w/Acute encephalopathy. From SNF. CSW following.                   Action/Plan:d/c SNF   Expected Discharge Date:                  Expected Discharge Plan:  Skilled Nursing Facility  In-House Referral:  Clinical Social Work  Discharge planning Services  CM Consult  Post Acute Care Choice:    Choice offered to:     DME Arranged:    DME Agency:     HH Arranged:    HH Agency:     Status of Service:  In process, will continue to follow  Medicare Important Message Given:    Date Medicare IM Given:    Medicare IM give by:    Date Additional Medicare IM Given:    Additional Medicare Important Message give by:     If discussed at Long Length of Stay Meetings, dates discussed:    Additional Comments:  Lanier ClamMahabir, Tommey Barret, RN 04/11/2015, 4:17 PM

## 2015-04-12 DIAGNOSIS — F0390 Unspecified dementia without behavioral disturbance: Secondary | ICD-10-CM

## 2015-04-12 LAB — BASIC METABOLIC PANEL
ANION GAP: 7 (ref 5–15)
Anion gap: 8 (ref 5–15)
BUN: 21 mg/dL — ABNORMAL HIGH (ref 6–20)
BUN: 21 mg/dL — ABNORMAL HIGH (ref 6–20)
CHLORIDE: 110 mmol/L (ref 101–111)
CHLORIDE: 111 mmol/L (ref 101–111)
CO2: 25 mmol/L (ref 22–32)
CO2: 27 mmol/L (ref 22–32)
CREATININE: 0.98 mg/dL (ref 0.61–1.24)
Calcium: 8.3 mg/dL — ABNORMAL LOW (ref 8.9–10.3)
Calcium: 8.4 mg/dL — ABNORMAL LOW (ref 8.9–10.3)
Creatinine, Ser: 1.08 mg/dL (ref 0.61–1.24)
GFR calc non Af Amer: >60 mL/min (ref >60)
GFR calc non Af Amer: >60 mL/min (ref >60)
Glucose, Bld: 133 mg/dL — ABNORMAL HIGH (ref 65–99)
Glucose, Bld: 101 mg/dL — ABNORMAL HIGH (ref 65–99)
POTASSIUM: 3.1 mmol/L — AB (ref 3.5–5.1)
POTASSIUM: 3.1 mmol/L — AB (ref 3.5–5.1)
SODIUM: 143 mmol/L (ref 135–145)
SODIUM: 145 mmol/L (ref 135–145)

## 2015-04-12 LAB — MAGNESIUM: MAGNESIUM: 1.9 mg/dL (ref 1.7–2.4)

## 2015-04-12 MED ORDER — POTASSIUM CHLORIDE CRYS ER 20 MEQ PO TBCR
40.0000 meq | EXTENDED_RELEASE_TABLET | Freq: Two times a day (BID) | ORAL | Status: DC
  Administered 2015-04-12: 40 meq via ORAL

## 2015-04-12 MED ORDER — SODIUM CHLORIDE 0.9 % IV SOLN
INTRAVENOUS | Status: DC
  Administered 2015-04-12: 10:00:00 via INTRAVENOUS

## 2015-04-12 MED ORDER — POTASSIUM CHLORIDE CRYS ER 20 MEQ PO TBCR
60.0000 meq | EXTENDED_RELEASE_TABLET | Freq: Once | ORAL | Status: AC
  Administered 2015-04-12: 60 meq via ORAL
  Filled 2015-04-12: qty 3

## 2015-04-12 NOTE — NC FL2 (Signed)
[place] MEDICAID FL2 LEVEL OF CARE SCREENING TOOL     IDENTIFICATION  Patient Name: Alexander Randolph Birthdate: [birth year] Sex: male Admission Date (Current Location): 04/10/2015  [facility] and [place] Number:  Producer, television/film/video and Address:  [facility],  [address] [place], [place] [address]      Provider Number: [id number]  Attending Physician Name and Address:  Jerald Kief, MD  Relative Name and Phone Number:       Current Level of Care: Hospital Recommended Level of Care: Skilled Nursing Facility Prior Approval Number:    Date Approved/Denied:   PASRR Number:  [id number])  Discharge Plan: SNF    Current Diagnoses: Patient Active Problem List   Diagnosis Date Noted  . Sepsis (HCC) 04/10/2015  . Hypernatremia 04/10/2015  . Acute renal failure superimposed on stage 3 chronic kidney disease (HCC) 04/10/2015  . Acute encephalopathy 04/10/2015  . Falls frequently 09/19/2014  . UTI (lower urinary tract infection) 09/19/2014  . Dementia   . PVD (peripheral vascular disease) (HCC)   . CAD S/P percutaneous coronary angioplasty   . Orthostatic hypotension   . Hyperlipidemia   . AAA (abdominal aortic aneurysm) without rupture (HCC) 07/09/2013  . Aftercare following surgery of the circulatory system, NEC 07/09/2013  . Abdominal aortic aneurysm (HCC) 06/03/2012    Orientation RESPIRATION BLADDER Height & Weight    Self  Normal Incontinent, Indwelling catheter   176 lbs.  BEHAVIORAL SYMPTOMS/MOOD NEUROLOGICAL BOWEL NUTRITION STATUS      Incontinent Diet (soft diet)  AMBULATORY STATUS COMMUNICATION OF NEEDS Skin   Extensive Assist Verbally Normal                       Personal Care Assistance Level of Assistance  Bathing, Feeding, Dressing Bathing Assistance: Limited assistance Feeding assistance: Limited assistance Dressing Assistance: Limited assistance     Functional Limitations Info             SPECIAL CARE FACTORS FREQUENCY                       Contractures      Additional Factors Info  Code Status, Allergies Code Status Info:  (DNR) Allergies Info:  (NKDA)           Current Medications (04/12/2015):  This is the current hospital active medication list Current Facility-Administered Medications  Medication Dose Route Frequency Provider Last Rate Last Dose  . aspirin EC tablet 81 mg  81 mg Oral Daily Dorothea Ogle, MD   81 mg at 04/11/15 6644  . dextrose 5 % and 0.2 % NaCl infusion   Intravenous Continuous Jerald Kief, MD 75 mL/hr at 04/12/15 [phone]    . divalproex (DEPAKOTE ER) 24 hr tablet 250 mg  250 mg Oral Daily Dorothea Ogle, MD   250 mg at 04/10/15 1453  . enoxaparin (LOVENOX) injection 40 mg  40 mg Subcutaneous Q24H Dorothea Ogle, MD   40 mg at 04/11/15 1712  . feeding supplement (ENSURE ENLIVE) (ENSURE ENLIVE) liquid 237 mL  237 mL Oral BID BM Jerald Kief, MD   237 mL at 04/11/15 1645  . midodrine (PROAMATINE) tablet 2.5 mg  2.5 mg Oral BID WC Dorothea Ogle, MD   2.5 mg at 04/12/15 4259     Discharge Medications: Please see discharge summary for a list of discharge medications.  Relevant Imaging Results:  Relevant Lab Results:   Additional Information  (  SSN: [id number])  Arlyss RepressHarrison, Alexander Wiest F, LCSW

## 2015-04-12 NOTE — Discharge Summary (Addendum)
Physician Discharge Summary  Alexander Randolph:[id number] DOB: 12-28-1931 DOA: 04/10/2015  PCP: Gaspar Garbe, MD  Admit date: 04/10/2015 Discharge date: 04/12/2015  Time spent: 20 minutes  Recommendations for Outpatient Follow-up:  1. Follow up with facility provider in 1-2 weeks 2. Please repeat Renal Panel (focus on sodium and potassium) and Magnesium level within one week  3. Please request Palliative Care consult at facility for worsening dementia  Discharge Diagnoses:  Principal Problem:   Acute encephalopathy Active Problems:   Abdominal aortic aneurysm (HCC)   Orthostatic hypotension   Dementia   Falls frequently   Sepsis (HCC)   Hypernatremia   Acute renal failure superimposed on stage 3 chronic kidney disease [facility])   Discharge Condition: Improved  Diet recommendation: Soft with thin liquids  Filed Weights   04/10/15 1403 04/11/15 0550 04/12/15 0438  Weight: 77.8 kg (171 lb 8.3 oz) 78.3 kg (172 lb 9.9 oz) 80.2 kg (176 lb 12.9 oz)    History of present illness:  Please review dictated H and P from 12/25 for details. Briefly,   Hospital Course:   Acute encephalopathy without sepsis - appears to be multifactorial and secondary to acute illness, dehydration, hypernatremia - Patient was initially continued with IVF, empiric ABX were started, however no identifiable infectious source identified. D/c'd abx - Mental status improved overnight with correction of sodium    Sepsis ruled out -Afebrile. Not tachycardic. No leukocytosis. MS change likely secondary to hypernatremia - no identifiable infectious source - d/c empiric abx   Hypernatremia - appears to be pre renal in etiology - continued with IVF with gradual normalization of sodium - Encourage good PO intake   Acute renal failure superimposed on stage 3 chronic kidney disease (HCC) - with baseline Cr 1.5 - 2.5 in the past 6 months - IVF was given as noted above   Orthostatic hypotension -  continue Midodrine    Dementia - with frequent falls - PT to return to SNF on d/c   Abdominal aortic aneurysm (HCC) - outpatient follow up   Lovenox SQ for DVT prophylaxis while admitted  Hypokalemia - Replaced - Would follow levels as outpt and correct as needed  Discharge Exam: Filed Vitals:   04/11/15 1317 04/11/15 2047 04/12/15 0438 04/12/15 1324  BP: 129/61 135/59 108/55 129/66  Pulse: 65 68 56 66  Temp: 98.8 F (37.1 C) 98.2 F (36.8 C) 98.3 F (36.8 C) 98 F (36.7 C)  TempSrc: Oral Axillary Axillary Axillary  Resp: 18 18 18 17   Height:      Weight:   80.2 kg (176 lb 12.9 oz)   SpO2: 95% 98% 93% 97%    General: Awake, in nad Cardiovascular: regular, s1, s2 Respiratory: normal resp effort, no wheezing  Discharge Instructions     Medication List    STOP taking these medications        amoxicillin-clavulanate 875-125 MG tablet  Commonly known as:  AUGMENTIN     levofloxacin 500 MG tablet  Commonly known as:  LEVAQUIN      TAKE these medications        albuterol (2.5 MG/3ML) 0.083% nebulizer solution  Commonly known as:  PROVENTIL  Take 3 mLs by nebulization every 8 (eight) hours. x7 days     aspirin 81 MG tablet  Take 81 mg by mouth daily.     clopidogrel 75 MG tablet  Commonly known as:  PLAVIX  Pt must make and appointment for additional refills     divalproex  250 MG 24 hr tablet  Commonly known as:  DEPAKOTE ER  Take 250 mg by mouth daily.     donepezil 10 MG tablet  Commonly known as:  ARICEPT  Take 10 mg by mouth at bedtime.     LORazepam 1 MG tablet  Commonly known as:  ATIVAN  Take 1 mg by mouth every 12 (twelve) hours as needed for anxiety.     midodrine 2.5 MG tablet  Commonly known as:  PROAMATINE  Take 2.5 mg by mouth 2 (two) times daily.     NAMENDA XR 28 MG Cp24 24 hr capsule  Generic drug:  memantine  Take 28 mg by mouth daily.     NON FORMULARY  Take 3 oz by mouth 3 (three) times daily.     simvastatin 40 MG  tablet  Commonly known as:  ZOCOR  Take 40 mg by mouth daily.       No Known Allergies Follow-up Information    Follow up with [facility] .   Specialty:  Skilled Nursing Facility   Contact information:   [address] [place] [address] [phone]       The results of significant diagnostics from this hospitalization (including imaging, microbiology, ancillary and laboratory) are listed below for reference.    Significant Diagnostic Studies: Ct Head Wo Contrast  04/10/2015  CLINICAL DATA:  Frequent falls. Bruising healing from fall several days ago. Fell today- c- collar intact. 02 sats decreased on scene 4LNC APPLIED FOR SUPPORT RR 24. Per staff pt went to transfer from his chair to the cough and fell to ground. NO LOC Hx of dementia EXAM: CT HEAD WITHOUT CONTRAST CT CERVICAL SPINE WITHOUT CONTRAST TECHNIQUE: Multidetector CT imaging of the head and cervical spine was performed following the standard protocol without intravenous contrast. Multiplanar CT image reconstructions of the cervical spine were also generated. COMPARISON:  None. 01/28/2013 FINDINGS: CT HEAD FINDINGS Atherosclerotic and physiologic intracranial calcifications. Diffuse parenchymal atrophy. Patchy areas of hypoattenuation in deep and periventricular white matter bilaterally. Negative for acute intracranial hemorrhage, mass lesion, acute infarction, midline shift, or mass-effect. Acute infarct may be inapparent on noncontrast CT. Ventricles and sulci symmetric. Bone windows demonstrate no focal lesion. CT CERVICAL SPINE FINDINGS Mild reversal of the normal cervical lordosis. Narrowing of interspaces C3 C4-C7 with small anterior and posterior endplate spurs at all levels. No prevertebral soft tissue swelling. Facets are seated. Negative for fracture. Bilateral calcified carotid bifurcation plaque. Visualized lung apices clear. IMPRESSION: 1.   1.  Negative for bleed or other acute intracranial  process. 2. Atrophy and nonspecific white matter changes. 3. No acute cervical spine findings. 4. Multilevel cervical spondylitic changes. 5. Loss of the normal cervical spine lordosis, which may be secondary to positioning, spasm, or soft tissue injury. 2. Electronically Signed   By: Corlis Leak M.D.   On: 04/10/2015 11:39   Ct Cervical Spine Wo Contrast  04/10/2015  CLINICAL DATA:  Frequent falls. Bruising healing from fall several days ago. Fell today- c- collar intact. 02 sats decreased on scene 4LNC APPLIED FOR SUPPORT RR 24. Per staff pt went to transfer from his chair to the cough and fell to ground. NO LOC Hx of dementia EXAM: CT HEAD WITHOUT CONTRAST CT CERVICAL SPINE WITHOUT CONTRAST TECHNIQUE: Multidetector CT imaging of the head and cervical spine was performed following the standard protocol without intravenous contrast. Multiplanar CT image reconstructions of the cervical spine were also generated. COMPARISON:  None. 01/28/2013 FINDINGS: CT  HEAD FINDINGS Atherosclerotic and physiologic intracranial calcifications. Diffuse parenchymal atrophy. Patchy areas of hypoattenuation in deep and periventricular white matter bilaterally. Negative for acute intracranial hemorrhage, mass lesion, acute infarction, midline shift, or mass-effect. Acute infarct may be inapparent on noncontrast CT. Ventricles and sulci symmetric. Bone windows demonstrate no focal lesion. CT CERVICAL SPINE FINDINGS Mild reversal of the normal cervical lordosis. Narrowing of interspaces C3 C4-C7 with small anterior and posterior endplate spurs at all levels. No prevertebral soft tissue swelling. Facets are seated. Negative for fracture. Bilateral calcified carotid bifurcation plaque. Visualized lung apices clear. IMPRESSION: 1.   1.  Negative for bleed or other acute intracranial process. 2. Atrophy and nonspecific white matter changes. 3. No acute cervical spine findings. 4. Multilevel cervical spondylitic changes. 5. Loss of the  normal cervical spine lordosis, which may be secondary to positioning, spasm, or soft tissue injury. 2. Electronically Signed   By: Corlis Leak  Hassell M.D.   On: 04/10/2015 11:39   Dg Chest Port 1 View  04/10/2015  CLINICAL DATA:  Cough, congestion, fever EXAM: PORTABLE CHEST 1 VIEW COMPARISON:  09/18/2014 FINDINGS: Cardiomediastinal silhouette is stable. No acute infiltrate or pleural effusion. No pulmonary edema. Stable bilateral basilar atelectasis or scarring. Degenerative changes bilateral shoulders. IMPRESSION: No active disease. Stable bilateral basilar atelectasis or scarring. Electronically Signed   By: Natasha MeadLiviu  Pop M.D.   On: 04/10/2015 11:27    Microbiology: Recent Results (from the past 240 hour(s))  Blood Culture (routine x 2)     Status: None (Preliminary result)   Collection Time: 04/10/15 11:00 AM  Result Value Ref Range Status   Specimen Description LEFT ANTECUBITAL  Final   Special Requests BOTTLES DRAWN AEROBIC AND ANAEROBIC 5CC  Final   Culture   Final    NO GROWTH 2 DAYS [facility]formed at [facility]    Report Status PENDING  Incomplete  Blood Culture (routine x 2)     Status: None (Preliminary result)   Collection Time: 04/10/15 11:05 AM  Result Value Ref Range Status   Specimen Description BLOOD LEFT ARM  Final   Special Requests BOTTLES DRAWN AEROBIC AND ANAEROBIC 4CC  Final   Culture   Final    NO GROWTH 2 DAYS Performed at [facility]    Report Status PENDING  Incomplete  Urine culture     Status: None   Collection Time: 04/10/15 11:06 AM  Result Value Ref Range Status   Specimen Description URINE, CATHETERIZED  Final   Special Requests NONE  Final   Culture   Final    NO GROWTH 1 DAY Performed at [facility]    Report Status 04/11/2015 FINAL  Final     Labs: Basic Metabolic Panel:  Recent Labs Lab 04/10/15 1105 04/10/15 1516 04/11/15 0525 04/11/15 1257 04/11/15 1709 04/11/15 2040 04/12/15 0840 04/12/15 1335  NA 156*  --  155*  154* 157* 151* 143 145  K 4.1  --  3.8  --   --   --  3.1* 3.1*  CL 120*  --  118*  --   --   --  110 111  CO2 25  --  26  --   --   --  25 27  GLUCOSE 116*  --  91  --   --   --  133* 101*  BUN 41*  --  31*  --   --   --  21* 21*  CREATININE 1.82* 1.70* 1.31*  --   --   --  1.08 0.98  CALCIUM 9.2  --  8.5*  --   --   --  8.3* 8.4*  MG  --   --   --   --   --   --   --  1.9   Liver Function Tests:  Recent Labs Lab 04/10/15 1105  AST 23  ALT 14*  ALKPHOS 77  BILITOT 1.1  PROT 7.3  ALBUMIN 3.5   No results for input(s): LIPASE, AMYLASE in the last 168 hours. No results for input(s): AMMONIA in the last 168 hours. CBC:  Recent Labs Lab 04/10/15 1105 04/10/15 1516 04/11/15 0525  WBC 8.0 8.4 10.3  NEUTROABS 6.5  --   --   HGB 11.3* 11.9* 11.1*  HCT 34.4* 37.8* 34.6*  MCV 105.8* 108.3* 105.8*  PLT 169 198 189   Cardiac Enzymes: No results for input(s): CKTOTAL, CKMB, CKMBINDEX, TROPONINI in the last 168 hours. BNP: BNP (last 3 results) No results for input(s): BNP in the last 8760 hours.  ProBNP (last 3 results) No results for input(s): PROBNP in the last 8760 hours.  CBG: No results for input(s): GLUCAP in the last 168 hours.   Signed:  Rhyan Radler, Scheryl Marten  [facility] 04/12/2015, 4:46 PM

## 2015-04-12 NOTE — Progress Notes (Signed)
Initial Nutrition Assessment  DOCUMENTATION CODES:   Not applicable  INTERVENTION:  - Continue Ensure Enlive BID, each supplement provides 350 kcal and 20 grams of protein - RD will continue to monitor for needs  NUTRITION DIAGNOSIS:   Unintentional weight loss related to other (see comment) (unknown cause) as evidenced by percent weight loss.  GOAL:   Patient will meet greater than or equal to 90% of their needs  MONITOR:   PO intake, Supplement acceptance, Weight trends, Labs, I & O's  REASON FOR ASSESSMENT:   Malnutrition Screening Tool  ASSESSMENT:   81 yo male with known dementia and resident of [facility], presented via EMS after found unresponsive at the facility. Please note that pt is unable to provide any history due to AMS and most of the information obtained from available records and ED doctor. Pt was apparently unresponsive in ED, resuscitated with IVF, 2 L NS and given vanc and zosyn. Became more alert and blood work showed severe hypernatremia. This change in mental status thought to be secondary to severe dehydration and ? Underlying sepsis of unclear source.  Pt seen for MST. BMI indicates normal weight status. Per chart review, pt ate 100% of breakfast this AM. Tech in pt's room at time of RD visit and assists with answering questions. Pt reports that he ate very well this AM and denies associated abdominal pain or nausea. Per report from him and tech, he ate grits, sausage, eggs, and drank orange juice. No concerns for chewing with soft diet or concerns for swallowing at this time.   Pt with hx of dementia and no family/visitors present. Per chart review, pt lost 25 lbs (12% body weight) in the past 6 months which is significant for time frame. Did not complete physical assessment at this time. Pt may meet criteria for malnutrition but unable to confirm at this time.   Pt meeting needs as of this AM but will need to continue to monitor intakes of meals  and supplement. Medications reviewed. Labs reviewed; K: 3.1 mmol/L, Ca: 8.3 mg/dL.   Diet Order:  DIET SOFT Room service appropriate?: Yes; Fluid consistency:: Thin  Skin:  Reviewed, no issues  Last BM:  PTA  Height:   Ht Readings from Last 1 Encounters:  04/10/15 6\' 4"  (1.93 m)    Weight:   Wt Readings from Last 1 Encounters:  04/12/15 176 lb 12.9 oz (80.2 kg)    Ideal Body Weight:  91.82 kg (kg)  BMI:  Body mass index is 21.53 kg/(m^2).  Estimated Nutritional Needs:   Kcal:  1600-1800  Protein:  65-75 grams  Fluid:  1.8 L/day  EDUCATION NEEDS:   No education needs identified at this time     Trenton GammonJessica Myalee Stengel, RD, LDN Inpatient Clinical Dietitian Pager # [phone] After hours/weekend pager # [phone]

## 2015-04-12 NOTE — Progress Notes (Signed)
Patient is set to discharge back to [facility] today. Patient & daughter aware. [facility] aware. Discharge packet given to RN, Neysa BonitoChristy. Daughter to transport patient around 4pm.     Lincoln MaxinKelly Meiling Hendriks, LCSW [facility] Clinical Social Worker cell #: [phone]

## 2015-04-12 NOTE — Progress Notes (Signed)
This RN is taking over nursing care of the patient and agrees with the previous RN's assessment. Will continue to monitor.  

## 2015-04-12 NOTE — Clinical Social Work Note (Signed)
Clinical Social Work Assessment  Patient Details  Name: Alexander Randolph MRN: [id number] Date of Birth: 1931-08-22  Date of referral:  04/12/15               Reason for consult:  Facility Placement                Permission sought to share information with:  Facility Industrial/product designerContact Representative Permission granted to share information::  Yes, Verbal Permission Granted  Name::        Agency::     Relationship::     Contact Information:     Housing/Transportation Living arrangements for the past 2 months:  Skilled Nursing Facility Source of Information:  Adult Children Patient Interpreter Needed:  None Criminal Activity/Legal Involvement Pertinent to Current Situation/Hospitalization:  No - Comment as needed Significant Relationships:  Adult Children Lives with:  Facility Resident Do you feel safe going back to the place where you live?  Yes Need for family participation in patient care:  Yes (Comment)  Care giving concerns:  CSW received consult that patient is admitted from SNF.    Social Worker assessment / plan:  CSW confirmed with patient's son, Alexander Randolph (cell#: [phone]) that patient plans to return to [facility] at discharge.   Employment status:  Retired Database administratornsurance information:  Managed Medicare PT Recommendations:  Not assessed at this time Information / Referral to community resources:  Skilled Nursing Facility  Patient/Family's Response to care:  Patient's son expressed concern over not being able to reach [facility], [name] [facility] aware.   Patient/Family's Understanding of and Emotional Response to Diagnosis, Current Treatment, and Prognosis:    Emotional Assessment Appearance:  Appears stated age Attitude/Demeanor/Rapport:    Affect (typically observed):    Orientation:  Oriented to Self Alcohol / Substance use:    Psych involvement (Current and /or in the community):     Discharge Needs  Concerns to be addressed:    Readmission within the  last 30 days:    Current discharge risk:    Barriers to Discharge:      Alexander RepressHarrison, [name], LCSW 04/12/2015, 8:47 AM

## 2015-04-13 LAB — URINE CULTURE: Culture: NO GROWTH

## 2015-04-15 LAB — CULTURE, BLOOD (ROUTINE X 2)
CULTURE: NO GROWTH
Culture: NO GROWTH

## 2015-05-18 DEATH — deceased

## 2015-08-19 IMAGING — CR DG CHEST 2V
4 series · 4 of 4 positions shown · non-contrast
Comparison: None.

CLINICAL DATA: Initial evaluation for acute weakness. History of
hypertension, COPD.

EXAM:
CHEST  2 VIEW

[w chest lat (1 of 2)]
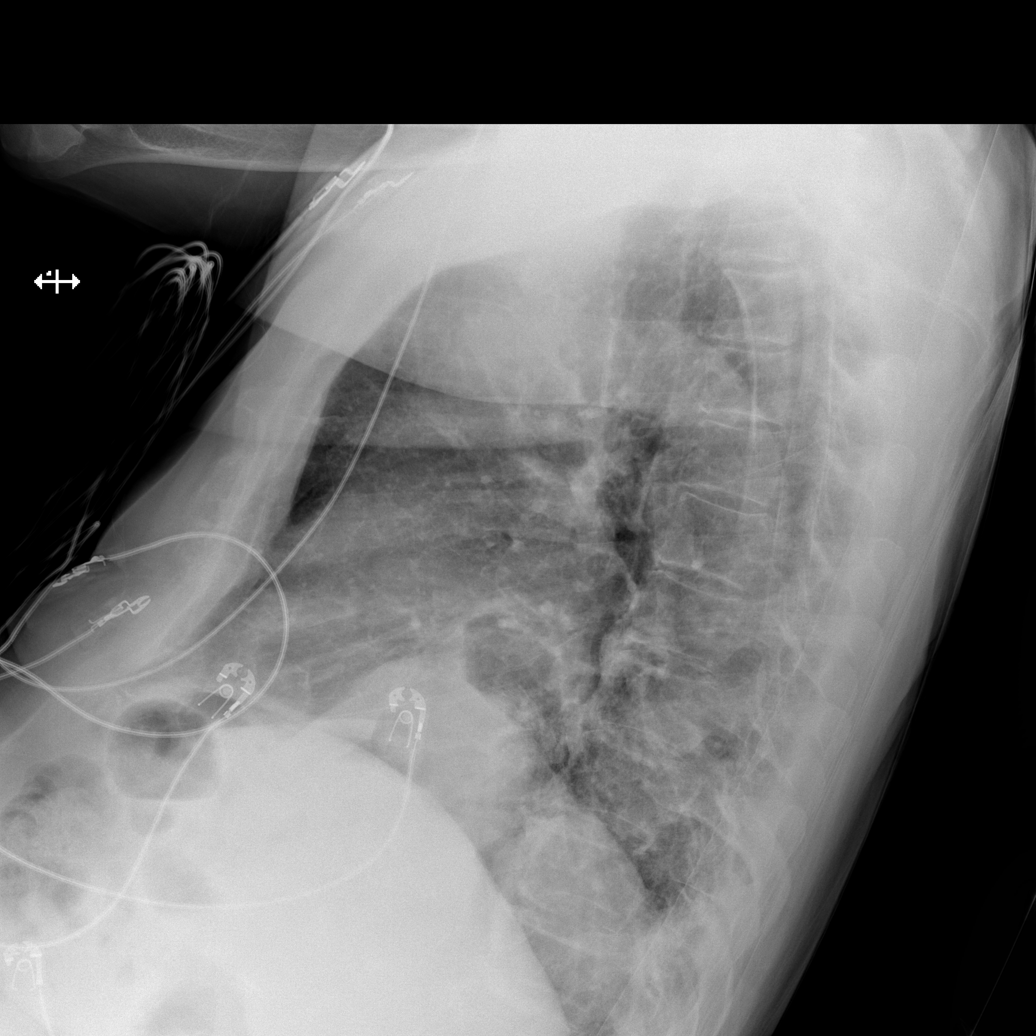

[w chest lat (2 of 2)]
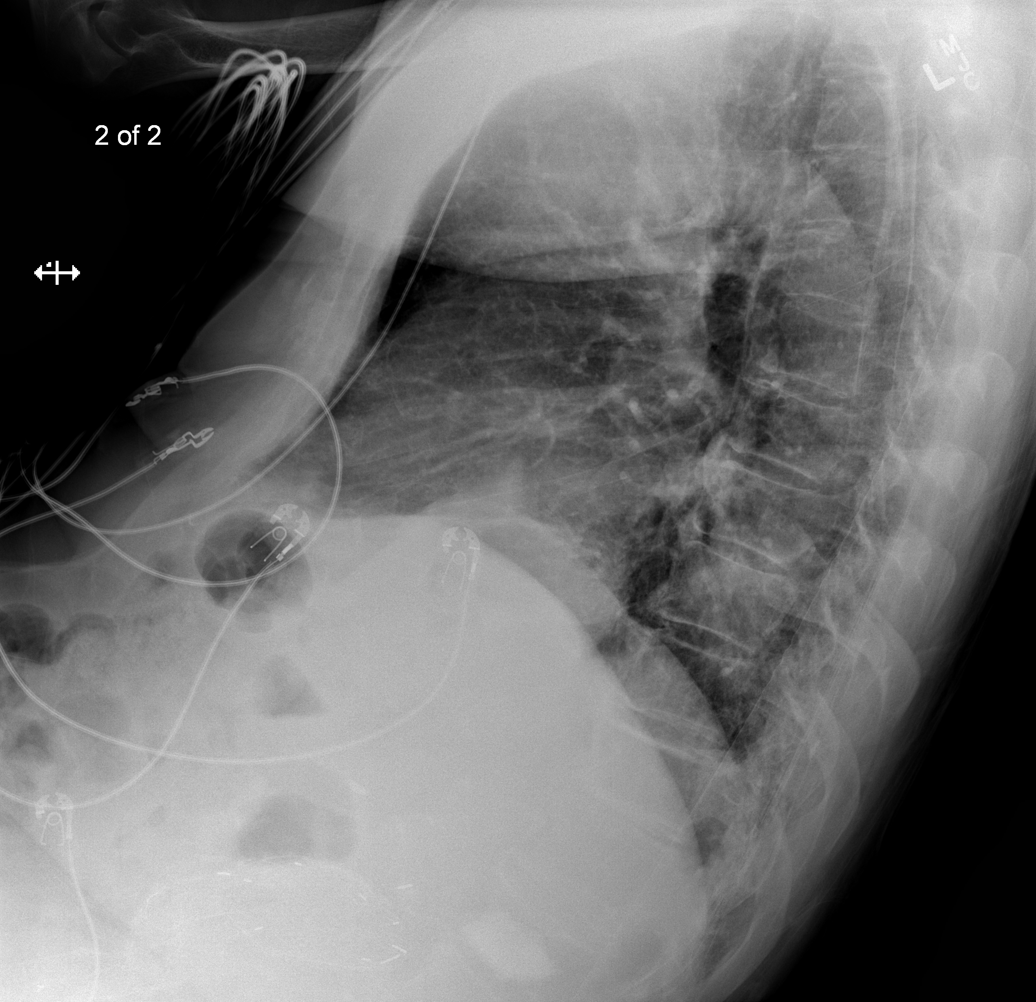

[x chest ap (1 of 2)]
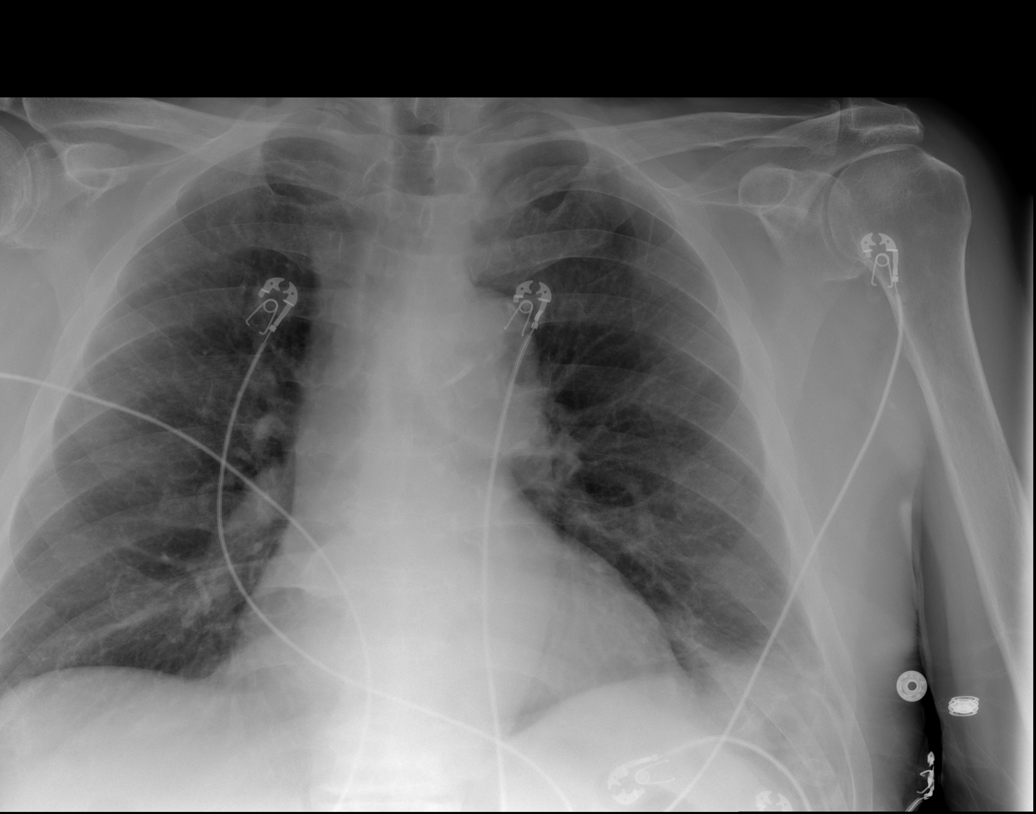

[x chest ap (2 of 2)]
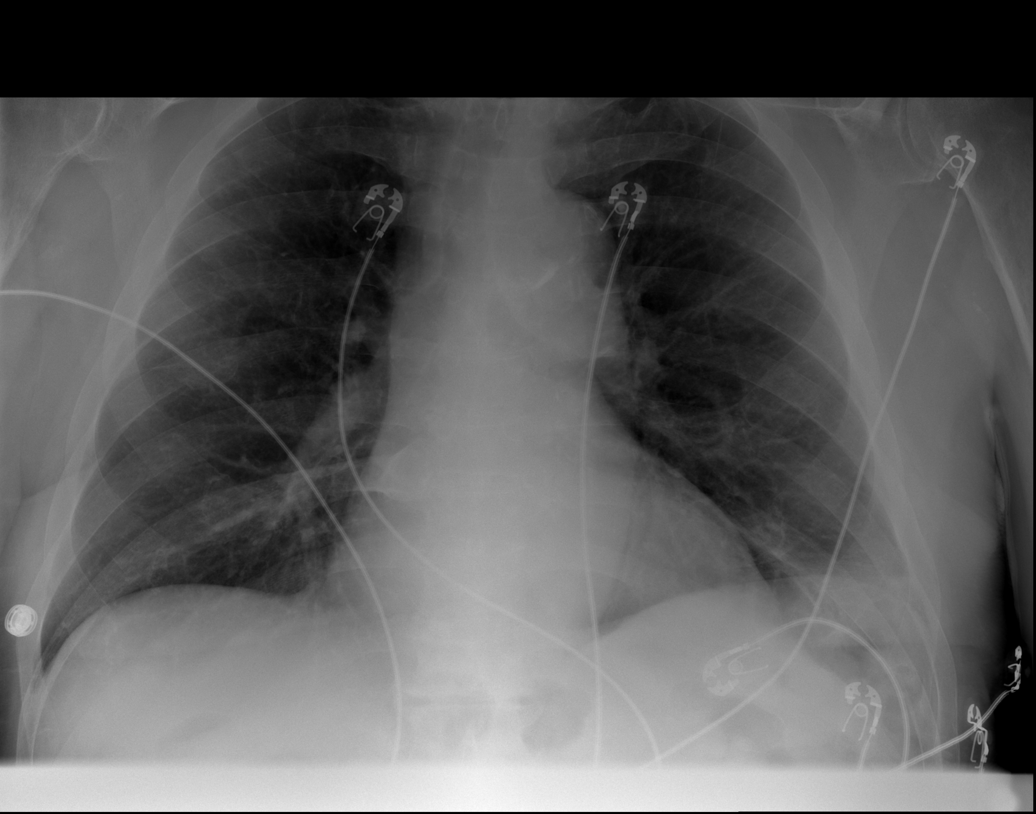

[4 of 4 positions shown; findings below may reference images not displayed]

FINDINGS: Mild cardiomegaly is stable from previous exam. Mediastinal
silhouette within normal limits. Tortuosity the intrathoracic aorta
noted. Scattered vascular calcifications present within the aortic
arch. Tracheal air column mildly bowed to the right at the level of
the aortic arch, stable.

Lungs are normally inflated. Mild linear opacities at the left lung
base most compatible with subsegmental atelectasis. No focal
infiltrates identified. No pulmonary edema or pleural effusion. No
pneumothorax.

Multilevel degenerative changes present within the visualized spine.
Sequelae of prior vertebral augmentation present within the upper
lumbar spine. No acute osseous abnormality.
IMPRESSION: 1. No active cardiopulmonary disease.
2. Mild subsegmental atelectasis at the left lung base.
3. Stable cardiomegaly without pulmonary edema.
